# Patient Record
Sex: Female | Born: 1975 | Race: White | Hispanic: No | Smoking: Former smoker
Health system: Southern US, Community
[De-identification: ages and names within clinical notes are randomized; demographics above are authoritative.]

## PROBLEM LIST (undated history)

## (undated) ENCOUNTER — Emergency Department (HOSPITAL_COMMUNITY): Admission: EM | Payer: Self-pay | Source: Home / Self Care

## (undated) DIAGNOSIS — E119 Type 2 diabetes mellitus without complications: Secondary | ICD-10-CM

## (undated) DIAGNOSIS — K219 Gastro-esophageal reflux disease without esophagitis: Secondary | ICD-10-CM

## (undated) DIAGNOSIS — Z8659 Personal history of other mental and behavioral disorders: Secondary | ICD-10-CM

## (undated) DIAGNOSIS — I10 Essential (primary) hypertension: Secondary | ICD-10-CM

## (undated) DIAGNOSIS — E7800 Pure hypercholesterolemia, unspecified: Secondary | ICD-10-CM

## (undated) DIAGNOSIS — M4800 Spinal stenosis, site unspecified: Secondary | ICD-10-CM

## (undated) DIAGNOSIS — E559 Vitamin D deficiency, unspecified: Secondary | ICD-10-CM

## (undated) DIAGNOSIS — L744 Anhidrosis: Secondary | ICD-10-CM

## (undated) DIAGNOSIS — F32A Depression, unspecified: Secondary | ICD-10-CM

## (undated) DIAGNOSIS — F329 Major depressive disorder, single episode, unspecified: Secondary | ICD-10-CM

## (undated) DIAGNOSIS — I7300 Raynaud's syndrome without gangrene: Secondary | ICD-10-CM

## (undated) DIAGNOSIS — F909 Attention-deficit hyperactivity disorder, unspecified type: Secondary | ICD-10-CM

## (undated) DIAGNOSIS — F419 Anxiety disorder, unspecified: Secondary | ICD-10-CM

## (undated) DIAGNOSIS — F319 Bipolar disorder, unspecified: Secondary | ICD-10-CM

## (undated) DIAGNOSIS — L309 Dermatitis, unspecified: Secondary | ICD-10-CM

## (undated) DIAGNOSIS — R232 Flushing: Secondary | ICD-10-CM

## (undated) HISTORY — DX: Raynaud's syndrome without gangrene: I73.00

## (undated) HISTORY — PX: DENTAL SURGERY: SHX609

## (undated) HISTORY — DX: Bipolar disorder, unspecified: F31.9

## (undated) HISTORY — PX: WISDOM TOOTH EXTRACTION: SHX21

## (undated) HISTORY — DX: Anhidrosis: L74.4

## (undated) HISTORY — DX: Personal history of other mental and behavioral disorders: Z86.59

## (undated) HISTORY — DX: Spinal stenosis, site unspecified: M48.00

## (undated) HISTORY — DX: Attention-deficit hyperactivity disorder, unspecified type: F90.9

## (undated) HISTORY — DX: Flushing: R23.2

## (undated) HISTORY — DX: Dermatitis, unspecified: L30.9

## (undated) HISTORY — DX: Major depressive disorder, single episode, unspecified: F32.9

## (undated) HISTORY — DX: Vitamin D deficiency, unspecified: E55.9

## (undated) HISTORY — DX: Gastro-esophageal reflux disease without esophagitis: K21.9

---

## 1998-09-18 ENCOUNTER — Emergency Department (HOSPITAL_COMMUNITY): Admission: EM | Admit: 1998-09-18 | Discharge: 1998-09-19 | Payer: Self-pay | Admitting: Emergency Medicine

## 1999-09-30 ENCOUNTER — Other Ambulatory Visit: Admission: RE | Admit: 1999-09-30 | Discharge: 1999-09-30 | Payer: Self-pay | Admitting: Obstetrics & Gynecology

## 2000-10-27 ENCOUNTER — Other Ambulatory Visit: Admission: RE | Admit: 2000-10-27 | Discharge: 2000-10-27 | Payer: Self-pay | Admitting: Obstetrics and Gynecology

## 2001-09-25 ENCOUNTER — Emergency Department (HOSPITAL_COMMUNITY): Admission: EM | Admit: 2001-09-25 | Discharge: 2001-09-25 | Payer: Self-pay | Admitting: Emergency Medicine

## 2002-07-24 ENCOUNTER — Encounter: Payer: Self-pay | Admitting: *Deleted

## 2002-07-24 ENCOUNTER — Ambulatory Visit (HOSPITAL_COMMUNITY): Admission: RE | Admit: 2002-07-24 | Discharge: 2002-07-24 | Payer: Self-pay | Admitting: *Deleted

## 2002-12-02 ENCOUNTER — Ambulatory Visit (HOSPITAL_BASED_OUTPATIENT_CLINIC_OR_DEPARTMENT_OTHER): Admission: RE | Admit: 2002-12-02 | Discharge: 2002-12-02 | Payer: Self-pay | Admitting: Family Medicine

## 2003-09-12 ENCOUNTER — Emergency Department (HOSPITAL_COMMUNITY): Admission: EM | Admit: 2003-09-12 | Discharge: 2003-09-12 | Payer: Self-pay | Admitting: Emergency Medicine

## 2005-09-30 ENCOUNTER — Encounter: Admission: RE | Admit: 2005-09-30 | Discharge: 2005-09-30 | Payer: Self-pay | Admitting: Family Medicine

## 2008-07-02 ENCOUNTER — Emergency Department (HOSPITAL_COMMUNITY): Admission: EM | Admit: 2008-07-02 | Discharge: 2008-07-02 | Payer: Self-pay | Admitting: Family Medicine

## 2010-02-09 ENCOUNTER — Encounter: Admission: RE | Admit: 2010-02-09 | Discharge: 2010-02-09 | Payer: Self-pay | Admitting: Family Medicine

## 2010-06-06 ENCOUNTER — Encounter: Payer: Self-pay | Admitting: Family Medicine

## 2010-06-06 ENCOUNTER — Encounter: Payer: Self-pay | Admitting: Urology

## 2014-07-14 ENCOUNTER — Emergency Department (HOSPITAL_BASED_OUTPATIENT_CLINIC_OR_DEPARTMENT_OTHER): Payer: Self-pay

## 2014-07-14 ENCOUNTER — Emergency Department (HOSPITAL_BASED_OUTPATIENT_CLINIC_OR_DEPARTMENT_OTHER)
Admission: EM | Admit: 2014-07-14 | Discharge: 2014-07-14 | Disposition: A | Discharge status: Home / Self Care | Payer: Self-pay | Attending: Emergency Medicine | Admitting: Emergency Medicine

## 2014-07-14 ENCOUNTER — Encounter (HOSPITAL_BASED_OUTPATIENT_CLINIC_OR_DEPARTMENT_OTHER): Payer: Self-pay | Admitting: *Deleted

## 2014-07-14 DIAGNOSIS — Z8659 Personal history of other mental and behavioral disorders: Secondary | ICD-10-CM | POA: Insufficient documentation

## 2014-07-14 DIAGNOSIS — Z79899 Other long term (current) drug therapy: Secondary | ICD-10-CM | POA: Insufficient documentation

## 2014-07-14 DIAGNOSIS — Z720 Tobacco use: Secondary | ICD-10-CM | POA: Insufficient documentation

## 2014-07-14 DIAGNOSIS — Z3202 Encounter for pregnancy test, result negative: Secondary | ICD-10-CM | POA: Insufficient documentation

## 2014-07-14 DIAGNOSIS — N390 Urinary tract infection, site not specified: Secondary | ICD-10-CM | POA: Insufficient documentation

## 2014-07-14 DIAGNOSIS — K219 Gastro-esophageal reflux disease without esophagitis: Secondary | ICD-10-CM | POA: Insufficient documentation

## 2014-07-14 HISTORY — DX: Anxiety disorder, unspecified: F41.9

## 2014-07-14 HISTORY — DX: Depression, unspecified: F32.A

## 2014-07-14 HISTORY — DX: Major depressive disorder, single episode, unspecified: F32.9

## 2014-07-14 LAB — COMPREHENSIVE METABOLIC PANEL
ALBUMIN: 4.4 g/dL (ref 3.5–5.2)
ALT: 24 U/L (ref 0–35)
ANION GAP: 3 — AB (ref 5–15)
AST: 24 U/L (ref 0–37)
Alkaline Phosphatase: 56 U/L (ref 39–117)
BILIRUBIN TOTAL: 0.3 mg/dL (ref 0.3–1.2)
BUN: 13 mg/dL (ref 6–23)
CHLORIDE: 110 mmol/L (ref 96–112)
CO2: 22 mmol/L (ref 19–32)
Calcium: 8.9 mg/dL (ref 8.4–10.5)
Creatinine, Ser: 0.72 mg/dL (ref 0.50–1.10)
GFR calc Af Amer: >90 mL/min (ref >90)
GFR calc non Af Amer: >90 mL/min (ref >90)
Glucose, Bld: 116 mg/dL — ABNORMAL HIGH (ref 70–99)
POTASSIUM: 4 mmol/L (ref 3.5–5.1)
SODIUM: 135 mmol/L (ref 135–145)
TOTAL PROTEIN: 7.2 g/dL (ref 6.0–8.3)

## 2014-07-14 LAB — URINE MICROSCOPIC-ADD ON

## 2014-07-14 LAB — URINALYSIS, ROUTINE W REFLEX MICROSCOPIC
BILIRUBIN URINE: NEGATIVE
Glucose, UA: NEGATIVE mg/dL
HGB URINE DIPSTICK: NEGATIVE
KETONES UR: NEGATIVE mg/dL
Leukocytes, UA: NEGATIVE
NITRITE: POSITIVE — AB
PH: 5.5 (ref 5.0–8.0)
Protein, ur: NEGATIVE mg/dL
SPECIFIC GRAVITY, URINE: 1.014 (ref 1.005–1.030)
UROBILINOGEN UA: 0.2 mg/dL (ref 0.0–1.0)

## 2014-07-14 LAB — CBC WITH DIFFERENTIAL/PLATELET
BASOS ABS: 0 10*3/uL (ref 0.0–0.1)
Basophils Relative: 1 % (ref 0–1)
EOS PCT: 3 % (ref 0–5)
Eosinophils Absolute: 0.2 10*3/uL (ref 0.0–0.7)
HEMATOCRIT: 43.1 % (ref 36.0–46.0)
HEMOGLOBIN: 14.4 g/dL (ref 12.0–15.0)
Lymphocytes Relative: 41 % (ref 12–46)
Lymphs Abs: 2.6 10*3/uL (ref 0.7–4.0)
MCH: 32.2 pg (ref 26.0–34.0)
MCHC: 33.4 g/dL (ref 30.0–36.0)
MCV: 96.4 fL (ref 78.0–100.0)
MONO ABS: 0.5 10*3/uL (ref 0.1–1.0)
MONOS PCT: 9 % (ref 3–12)
Neutro Abs: 3 10*3/uL (ref 1.7–7.7)
Neutrophils Relative %: 46 % (ref 43–77)
Platelets: 238 10*3/uL (ref 150–400)
RBC: 4.47 MIL/uL (ref 3.87–5.11)
RDW: 12.7 % (ref 11.5–15.5)
WBC: 6.3 10*3/uL (ref 4.0–10.5)

## 2014-07-14 LAB — PREGNANCY, URINE: Preg Test, Ur: NEGATIVE

## 2014-07-14 LAB — LIPASE, BLOOD: LIPASE: 42 U/L (ref 11–59)

## 2014-07-14 MED ORDER — GI COCKTAIL ~~LOC~~
30.0000 mL | Freq: Once | ORAL | Status: AC
  Administered 2014-07-14: 30 mL via ORAL
  Filled 2014-07-14: qty 30

## 2014-07-14 MED ORDER — NITROFURANTOIN MONOHYD MACRO 100 MG PO CAPS: 100.0000 mg | ORAL_CAPSULE | Freq: Two times a day (BID) | ORAL | Status: DC

## 2014-07-14 MED ORDER — PANTOPRAZOLE SODIUM 40 MG PO TBEC: 40.0000 mg | DELAYED_RELEASE_TABLET | Freq: Every day | ORAL | Status: DC

## 2014-07-14 MED ORDER — PANTOPRAZOLE SODIUM 40 MG PO TBEC
40.0000 mg | DELAYED_RELEASE_TABLET | Freq: Once | ORAL | Status: AC
  Administered 2014-07-14: 40 mg via ORAL
  Filled 2014-07-14: qty 1

## 2014-07-14 MED ORDER — NITROFURANTOIN MONOHYD MACRO 100 MG PO CAPS
100.0000 mg | ORAL_CAPSULE | Freq: Once | ORAL | Status: AC
  Administered 2014-07-14: 100 mg via ORAL
  Filled 2014-07-14: qty 1

## 2014-07-14 NOTE — ED Provider Notes (Signed)
CSN: [id number]     Arrival date & time 07/14/14  [id number] History   First MD Initiated Contact with Patient 07/14/14 [phone]     Chief Complaint  Patient presents with  . Abdominal Pain     (Consider location/radiation/quality/duration/timing/severity/associated sxs/prior Treatment) Patient is a [age] y.o. female presenting with GERD. The history is provided by the patient.  Gastrophageal Reflux This is a new problem. The current episode started more than 1 week ago (60 days ago). The problem has not changed since onset.Pertinent negatives include no chest pain, no headaches and no shortness of breath. Nothing aggravates the symptoms. Nothing relieves the symptoms. Treatments tried: maalox mylanta and tagament. The treatment provided no relief.    Past Medical History  Diagnosis Date  . Depression   . Anxiety    History reviewed. No pertinent past surgical history. History reviewed. No pertinent family history. History  Substance Use Topics  . Smoking status: Current Every Day Smoker  . Smokeless tobacco: Not on file  . Alcohol Use: No   OB History    No data available     Review of Systems  Respiratory: Negative for shortness of breath.   Cardiovascular: Negative for chest pain.  Gastrointestinal: Negative for vomiting and constipation.  Neurological: Negative for headaches.  All other systems reviewed and are negative.     Allergies  Review of patient's allergies indicates not on file.  Home Medications   Prior to Admission medications   Medication Sig Start Date End Date Taking? Authorizing Provider  nitrofurantoin, macrocrystal-monohydrate, (MACROBID) 100 MG capsule Take 1 capsule (100 mg total) by mouth 2 (two) times daily. X 3 days 07/14/14   Ashby Leflore K Thomes Burak-Rasch, MD  pantoprazole (PROTONIX) 40 MG tablet Take 1 tablet (40 mg total) by mouth daily. 07/14/14   Osiel Stick K Rasul Decola-Rasch, MD   BP 144/91 mmHg  Pulse 97  Temp(Src) 98.3 F (36.8 C) (Oral)  Resp 23  Ht 5\' 6"   (1.676 m)  Wt 350 lb (158.759 kg)  BMI 56.52 kg/m2  SpO2 99%  LMP 06/17/2014 Physical Exam  Constitutional: She is oriented to person, place, and time. She appears well-developed and well-nourished. No distress.  HENT:  Head: Normocephalic and atraumatic.  Mouth/Throat: Oropharynx is clear and moist. No oropharyngeal exudate.  Eyes: Conjunctivae are normal. Pupils are equal, round, and reactive to light.  Neck: Normal range of motion. Neck supple.  Cardiovascular: Normal rate, regular rhythm and intact distal pulses.   Pulmonary/Chest: Effort normal and breath sounds normal. No respiratory distress. She has no wheezes. She has no rales.  Abdominal: Soft. Bowel sounds are increased. There is no tenderness. There is no rigidity, no rebound, no guarding, no tenderness at McBurney's point and negative Murphy's sign.  Musculoskeletal: Normal range of motion.  Neurological: She is alert and oriented to person, place, and time.  Skin: Skin is warm and dry.  Psychiatric: She has a normal mood and affect.    ED Course  Procedures (including critical care time) Labs Review Labs Reviewed  URINALYSIS, ROUTINE W REFLEX MICROSCOPIC - Abnormal; Notable for the following:    Nitrite POSITIVE (*)    All other components within normal limits  URINE MICROSCOPIC-ADD ON - Abnormal; Notable for the following:    Squamous Epithelial / LPF FEW (*)    Bacteria, UA MANY (*)    All other components within normal limits  COMPREHENSIVE METABOLIC PANEL - Abnormal; Notable for the following:    Glucose, Bld 116 (*)  Anion gap 3 (*)    All other components within normal limits  PREGNANCY, URINE  CBC WITH DIFFERENTIAL/PLATELET  LIPASE, BLOOD    Imaging Review No results found.   EKG Interpretation None      MDM   Final diagnoses:  UTI (lower urinary tract infection)  Gastroesophageal reflux disease without esophagitis    Patient felt markedly improved post protonix will prescribe same and  macrobid for UTI and close follow up with GI.  Strict return precautions given   Anae Hams K Kaci Freel-Rasch, MD 07/14/14 [phone]

## 2014-07-14 NOTE — ED Notes (Signed)
Transported to xray 

## 2014-07-14 NOTE — ED Notes (Signed)
MD with pt  

## 2014-07-14 NOTE — ED Notes (Signed)
Returned from xray

## 2014-07-14 NOTE — ED Notes (Signed)
C/o mid upper abd pain that started around Christmas time. States she has had issues with reflux. Describes pain as sharp and intermittent. States at times pain radiates into her back. Denies any vomiting. C/o mild nausea at times. Denies any present. States she is taking Rolaids, but with little relief. Has tried OTC tagament without relief.

## 2014-07-16 ENCOUNTER — Ambulatory Visit: Payer: Self-pay | Attending: Internal Medicine | Admitting: Internal Medicine

## 2014-07-16 ENCOUNTER — Encounter: Payer: Self-pay | Admitting: Internal Medicine

## 2014-07-16 VITALS — BP 158/91 | HR 91 | Temp 98.4°F | Resp 16 | Ht 66.0 in | Wt 331.0 lb

## 2014-07-16 DIAGNOSIS — I10 Essential (primary) hypertension: Secondary | ICD-10-CM | POA: Insufficient documentation

## 2014-07-16 DIAGNOSIS — R030 Elevated blood-pressure reading, without diagnosis of hypertension: Secondary | ICD-10-CM

## 2014-07-16 DIAGNOSIS — F32A Depression, unspecified: Secondary | ICD-10-CM

## 2014-07-16 DIAGNOSIS — IMO0001 Reserved for inherently not codable concepts without codable children: Secondary | ICD-10-CM

## 2014-07-16 DIAGNOSIS — K219 Gastro-esophageal reflux disease without esophagitis: Secondary | ICD-10-CM | POA: Insufficient documentation

## 2014-07-16 DIAGNOSIS — F329 Major depressive disorder, single episode, unspecified: Secondary | ICD-10-CM | POA: Insufficient documentation

## 2014-07-16 MED ORDER — PANTOPRAZOLE SODIUM 40 MG PO TBEC: 40.0000 mg | DELAYED_RELEASE_TABLET | Freq: Every day | ORAL | Status: DC

## 2014-07-16 NOTE — Progress Notes (Signed)
Patient ID: Patricia Calderon, female   DOB: [birth year], 39 y.o.   MRN: [id number]  OZH:[id number]  GEX:[id number]  DOB - 1975-08-29  CC:  Chief Complaint  Patient presents with  . Establish Care       HPI: Patricia Calderon is a 39 y.o. female here today to establish medical care.  Patient has a past medical history of anxiety, depression, and GERD. Patient presents to clinic today with several complaints.  Was diagnosed in 1996 but has not had any treatment since 2012 when she lost her insurance. She was paxil and xanax.   Has been having acid reflux since December with lump in throat, belching, slight vomiting, some epigastric pain. Worse at nighttime. She was recently given Protonix two days ago at the local ER.    Patient has No headache, No chest pain, No abdominal pain - No Nausea, No new weakness tingling or numbness, No Cough - SOB.  Not on File Past Medical History  Diagnosis Date  . Depression   . Anxiety   . GERD (gastroesophageal reflux disease)   . Vitamin D deficiency    Current Outpatient Prescriptions on File Prior to Visit  Medication Sig Dispense Refill  . nitrofurantoin, macrocrystal-monohydrate, (MACROBID) 100 MG capsule Take 1 capsule (100 mg total) by mouth 2 (two) times daily. X 3 days 6 capsule 0  . pantoprazole (PROTONIX) 40 MG tablet Take 1 tablet (40 mg total) by mouth daily. 30 tablet 0   No current facility-administered medications on file prior to visit.   Family History  Problem Relation Age of Onset  . Diabetes Mother   . Cancer Mother   . Diabetes Father    History   Social History  . Marital Status: Single    Spouse Name: N/A  . Number of Children: N/A  . Years of Education: N/A   Occupational History  . Not on file.   Social History Main Topics  . Smoking status: Current Every Day Smoker -- 0.25 packs/day  . Smokeless tobacco: Not on file  . Alcohol Use: No  . Drug Use: No  . Sexual Activity: Not on file   Other Topics Concern    . Not on file   Social History Narrative    Review of Systems: See HPI   Objective:   Filed Vitals:   07/16/14 1455  BP: 158/91  Pulse: 91  Temp: 98.4 F (36.9 C)  Resp: 16    Physical Exam  Cardiovascular: Normal rate, regular rhythm and normal heart sounds.   Pulmonary/Chest: Effort normal and breath sounds normal.  Abdominal: Soft. Bowel sounds are normal. There is no tenderness.  Musculoskeletal: She exhibits no edema.     Lab Results  Component Value Date   WBC 6.3 07/14/2014   HGB 14.4 07/14/2014   HCT 43.1 07/14/2014   MCV 96.4 07/14/2014   PLT 238 07/14/2014   Lab Results  Component Value Date   CREATININE 0.72 07/14/2014   BUN 13 07/14/2014   NA 135 07/14/2014   K 4.0 07/14/2014   CL 110 07/14/2014   CO2 22 07/14/2014    No results found for: HGBA1C Lipid Panel  No results found for: CHOL, TRIG, HDL, CHOLHDL, VLDL, LDLCALC     Assessment and plan:   Loree was seen today for establish care.  Diagnoses and all orders for this visit:  Elevated BP If BP is elevated at next visit, I will begin patient on HCTZ 12.5 mg.   Explained to patient  diet modifications that may contribute to elevated BP. Patient will avoid foods that are high in sodium such as  canned soups and vegetables, tomato juice, commercial baked goods, commercially prepared frozen or canned entrees and sauces. Avoid salty snacks, added salt when cooking, and substituting for low sodium herbs or spices Explained exercise regimen of cardio at least three times weekly to help lower BP and cholesterol  Gastroesophageal reflux disease, esophagitis presence not specified Orders: -     pantoprazole (PROTONIX) 40 MG tablet; Take 1 tablet (40 mg total) by mouth daily. Discussed diet and weight with patient relating to acid reflux.  Went over things that may exacerbate acid reflux such as tomatoes, spicy foods, coffee, carbonated beverages, chocolates, etc.  Advised patient to avoid laying  down at least two hours after meals and sleep with HOB elevated.   Depression Ref[facility] to [facility], she reports a family history of bipolar disorder   Return for 1-2 physical .     Holland CommonsKECK, Shanica Castellanos, NP-C [facility] [phone] 07/16/2014, 3:47 PM

## 2014-07-16 NOTE — Progress Notes (Signed)
Patient is here to establish care Patient is c/o of acid reflux  Patient is expressing anxiety and depression Patient has no insurance and is asking about free medications and orange card, [facility] discout application Patient has not had pap smear in years(10+)

## 2014-07-16 NOTE — Patient Instructions (Signed)

## 2014-08-06 ENCOUNTER — Encounter: Payer: Self-pay | Admitting: Internal Medicine

## 2014-08-11 ENCOUNTER — Ambulatory Visit: Payer: Self-pay

## 2014-08-13 ENCOUNTER — Encounter: Payer: Self-pay | Admitting: Internal Medicine

## 2014-08-27 ENCOUNTER — Ambulatory Visit: Payer: Self-pay | Attending: Internal Medicine | Admitting: Internal Medicine

## 2014-08-27 ENCOUNTER — Encounter: Payer: Self-pay | Admitting: Internal Medicine

## 2014-08-27 VITALS — BP 149/86 | HR 97 | Temp 98.7°F | Resp 16 | Ht 66.0 in | Wt 335.0 lb

## 2014-08-27 DIAGNOSIS — M25561 Pain in right knee: Secondary | ICD-10-CM

## 2014-08-27 DIAGNOSIS — K219 Gastro-esophageal reflux disease without esophagitis: Secondary | ICD-10-CM

## 2014-08-27 MED ORDER — OMEPRAZOLE 20 MG PO CPDR: 20.0000 mg | DELAYED_RELEASE_CAPSULE | Freq: Two times a day (BID) | ORAL | Status: DC

## 2014-08-27 NOTE — Progress Notes (Signed)
F/U Acid Reflux Complaining of Rt Knee pain x 3 wk  No Hx injury

## 2014-08-27 NOTE — Progress Notes (Signed)
Patient ID: Patricia HoffmannJennifer E Calderon, female   DOB: 28-Aug-1975, 39 y.o.   MRN: [id number]  CC: acid reflux, knee pain  HPI: Patricia ColeJennifer Calderon is a 39 y.o. female here today for a follow up visit.  Patient has past medical history of depression, anxiety, and GERD. Patient reports that she has dramatically changed her diet and has eliminated many of the foods that caused her to have acid reflux. She states that she takes her protonix around 4 am every morning and does not eat until 8 am. She states that she constantly has a stomach churning sensation and reflux in her throat. Denies diarrha. Has some abdominal pain and nausea. She has c/o of right knee pain for 3 weeks. She reports that the pain has improved dramatically. She has been taking oTC pain aid that have helped with discomfort. She denies injury but reports that her knee felt as if it was popping out of place.   Patient has No headache, No chest pain, No new weakness tingling or numbness, No Cough - SOB.  No Known Allergies Past Medical History  Diagnosis Date  . Depression   . Anxiety   . GERD (gastroesophageal reflux disease)   . Vitamin D deficiency    Current Outpatient Prescriptions on File Prior to Visit  Medication Sig Dispense Refill  . acetaminophen (TYLENOL) 325 MG tablet Take 650 mg by mouth every 6 (six) hours as needed for mild pain.    . pantoprazole (PROTONIX) 40 MG tablet Take 1 tablet (40 mg total) by mouth daily. 30 tablet 4  . nitrofurantoin, macrocrystal-monohydrate, (MACROBID) 100 MG capsule Take 1 capsule (100 mg total) by mouth 2 (two) times daily. X 3 days (Patient not taking: Reported on 08/27/2014) 6 capsule 0   No current facility-administered medications on file prior to visit.   Family History  Problem Relation Age of Onset  . Diabetes Mother   . Cancer Mother   . Diabetes Father    History   Social History  . Marital Status: Single    Spouse Name: N/A  . Number of Children: N/A  . Years of Education: N/A    Occupational History  . Not on file.   Social History Main Topics  . Smoking status: Current Every Day Smoker -- 0.25 packs/day  . Smokeless tobacco: Not on file  . Alcohol Use: No  . Drug Use: No  . Sexual Activity: Not on file   Other Topics Concern  . Not on file   Social History Narrative    Review of Systems: See HPI   Objective:   Filed Vitals:   08/27/14 1428  BP: 149/86  Pulse: 97  Temp: 98.7 F (37.1 C)  Resp: 16    Physical Exam  Constitutional: She is oriented to person, place, and time.  Cardiovascular: Normal rate, regular rhythm and normal heart sounds.   Pulmonary/Chest: Effort normal and breath sounds normal.  Abdominal: Soft. Bowel sounds are normal. She exhibits no distension. There is no tenderness.  Neurological: She is alert and oriented to person, place, and time.     Lab Results  Component Value Date   WBC 6.3 07/14/2014   HGB 14.4 07/14/2014   HCT 43.1 07/14/2014   MCV 96.4 07/14/2014   PLT 238 07/14/2014   Lab Results  Component Value Date   CREATININE 0.72 07/14/2014   BUN 13 07/14/2014   NA 135 07/14/2014   K 4.0 07/14/2014   CL 110 07/14/2014   CO2 22 07/14/2014  No results found for: HGBA1C Lipid Panel  No results found for: CHOL, TRIG, HDL, CHOLHDL, VLDL, LDLCALC     Assessment and plan:   Diagnoses and all orders for this visit:  Gastroesophageal reflux disease, esophagitis presence not specified Orders: -     omeprazole (PRILOSEC) 20 MG capsule; Take 1 capsule (20 mg total) by mouth 2 (two) times daily before a meal. Patient will stop Protonix and switch to omeprazole 20 mg twice a day. If patient is unable to get relief from acid reflux with this regimen, and we may possibly need to do GI referral.  Right knee pain Pacemaker T the take Tylenol  Morbid obesity Stressed weight loss the patient in order to assist with GERD and pressure on her knees   Patient may follow-up as symptoms do not improve       Patricia Commons, NP-C [facility] [phone] 08/27/2014, 3:15 PM

## 2014-08-27 NOTE — Patient Instructions (Signed)
Gastroesophageal Reflux Disease, Adult Gastroesophageal reflux disease (GERD) happens when acid from your stomach flows up into the esophagus. When acid comes in contact with the esophagus, the acid causes soreness (inflammation) in the esophagus. Over time, GERD may create small holes (ulcers) in the lining of the esophagus. CAUSES   Increased body weight. This puts pressure on the stomach, making acid rise from the stomach into the esophagus.  Smoking. This increases acid production in the stomach.  Drinking alcohol. This causes decreased pressure in the lower esophageal sphincter (valve or ring of muscle between the esophagus and stomach), allowing acid from the stomach into the esophagus.  Late evening meals and a full stomach. This increases pressure and acid production in the stomach.  A malformed lower esophageal sphincter. Sometimes, no cause is found. SYMPTOMS   Burning pain in the lower part of the mid-chest behind the breastbone and in the mid-stomach area. This may occur twice a week or more often.  Trouble swallowing.  Sore throat.  Dry cough.  Asthma-like symptoms including chest tightness, shortness of breath, or wheezing. DIAGNOSIS  Your caregiver may be able to diagnose GERD based on your symptoms. In some cases, X-rays and other tests may be done to check for complications or to check the condition of your stomach and esophagus. TREATMENT  Your caregiver may recommend over-the-counter or prescription medicines to help decrease acid production. Ask your caregiver before starting or adding any new medicines.  HOME CARE INSTRUCTIONS   Change the factors that you can control. Ask your caregiver for guidance concerning weight loss, quitting smoking, and alcohol consumption.  Avoid foods and drinks that make your symptoms worse, such as:  Caffeine or alcoholic drinks.  Chocolate.  Peppermint or mint flavorings.  Garlic and onions.  Spicy foods.  Citrus fruits,  such as oranges, lemons, or limes.  Tomato-based foods such as sauce, chili, salsa, and pizza.  Fried and fatty foods.  Avoid lying down for the 3 hours prior to your bedtime or prior to taking a nap.  Eat small, frequent meals instead of large meals.  Wear loose-fitting clothing. Do not wear anything tight around your waist that causes pressure on your stomach.  Raise the head of your bed 6 to 8 inches with wood blocks to help you sleep. Extra pillows will not help.  Only take over-the-counter or prescription medicines for pain, discomfort, or fever as directed by your caregiver.  Do not take aspirin, ibuprofen, or other nonsteroidal anti-inflammatory drugs (NSAIDs). SEEK IMMEDIATE MEDICAL CARE IF:   You have pain in your arms, neck, jaw, teeth, or back.  Your pain increases or changes in intensity or duration.  You develop nausea, vomiting, or sweating (diaphoresis).  You develop shortness of breath, or you faint.  Your vomit is green, yellow, black, or looks like coffee grounds or blood.  Your stool is red, bloody, or black. These symptoms could be signs of other problems, such as heart disease, gastric bleeding, or esophageal bleeding. MAKE SURE YOU:   Understand these instructions.  Will watch your condition.  Will get help right away if you are not doing well or get worse. Document Released: 02/09/2005 Document Revised: 07/25/2011 Document Reviewed: 11/19/2010 Exit[employer] Patient Information 2015 [employer]. This information is not intended to replace advice given to you by your health care provider. Make sure you discuss any questions you have with your health care provider.  

## 2014-08-31 DIAGNOSIS — K219 Gastro-esophageal reflux disease without esophagitis: Secondary | ICD-10-CM | POA: Insufficient documentation

## 2014-10-24 ENCOUNTER — Ambulatory Visit: Payer: Self-pay

## 2014-12-12 ENCOUNTER — Ambulatory Visit: Payer: Self-pay | Attending: Internal Medicine | Admitting: Internal Medicine

## 2014-12-12 ENCOUNTER — Encounter: Payer: Self-pay | Admitting: Internal Medicine

## 2014-12-12 VITALS — BP 145/82 | HR 107 | Temp 98.4°F | Resp 16 | Ht 66.5 in | Wt 334.0 lb

## 2014-12-12 DIAGNOSIS — Z720 Tobacco use: Secondary | ICD-10-CM

## 2014-12-12 DIAGNOSIS — K219 Gastro-esophageal reflux disease without esophagitis: Secondary | ICD-10-CM | POA: Insufficient documentation

## 2014-12-12 DIAGNOSIS — N644 Mastodynia: Secondary | ICD-10-CM | POA: Insufficient documentation

## 2014-12-12 DIAGNOSIS — Z716 Tobacco abuse counseling: Secondary | ICD-10-CM | POA: Insufficient documentation

## 2014-12-12 DIAGNOSIS — F17210 Nicotine dependence, cigarettes, uncomplicated: Secondary | ICD-10-CM | POA: Insufficient documentation

## 2014-12-12 DIAGNOSIS — F419 Anxiety disorder, unspecified: Secondary | ICD-10-CM | POA: Insufficient documentation

## 2014-12-12 DIAGNOSIS — R531 Weakness: Secondary | ICD-10-CM | POA: Insufficient documentation

## 2014-12-12 DIAGNOSIS — Z1239 Encounter for other screening for malignant neoplasm of breast: Secondary | ICD-10-CM

## 2014-12-12 DIAGNOSIS — Z803 Family history of malignant neoplasm of breast: Secondary | ICD-10-CM | POA: Insufficient documentation

## 2014-12-12 DIAGNOSIS — Z79899 Other long term (current) drug therapy: Secondary | ICD-10-CM | POA: Insufficient documentation

## 2014-12-12 DIAGNOSIS — I10 Essential (primary) hypertension: Secondary | ICD-10-CM | POA: Insufficient documentation

## 2014-12-12 DIAGNOSIS — F329 Major depressive disorder, single episode, unspecified: Secondary | ICD-10-CM | POA: Insufficient documentation

## 2014-12-12 DIAGNOSIS — E559 Vitamin D deficiency, unspecified: Secondary | ICD-10-CM | POA: Insufficient documentation

## 2014-12-12 DIAGNOSIS — F17200 Nicotine dependence, unspecified, uncomplicated: Secondary | ICD-10-CM

## 2014-12-12 LAB — CBC
HEMATOCRIT: 43.2 % (ref 36.0–46.0)
Hemoglobin: 14.2 g/dL (ref 12.0–15.0)
MCH: 30.6 pg (ref 26.0–34.0)
MCHC: 32.9 g/dL (ref 30.0–36.0)
MCV: 93.1 fL (ref 78.0–100.0)
MPV: 9.7 fL (ref 8.6–12.4)
Platelets: 332 10*3/uL (ref 150–400)
RBC: 4.64 MIL/uL (ref 3.87–5.11)
RDW: 13.7 % (ref 11.5–15.5)
WBC: 10 10*3/uL (ref 4.0–10.5)

## 2014-12-12 LAB — TSH: TSH: 3.233 u[IU]/mL (ref 0.350–4.500)

## 2014-12-12 MED ORDER — HYDROXYZINE HCL 25 MG PO TABS
25.0000 mg | ORAL_TABLET | Freq: Two times a day (BID) | ORAL | Status: DC | PRN
Start: 2014-12-12 — End: 2016-01-17

## 2014-12-12 MED ORDER — AMLODIPINE BESYLATE 5 MG PO TABS: 5.0000 mg | ORAL_TABLET | Freq: Every day | ORAL | Status: DC

## 2014-12-12 NOTE — Progress Notes (Signed)
Patient ID: Patricia Calderon, female   DOB: 1975/08/08, 39 y.o.   MRN: [id number]  CC: right breast pain, dizziness   HPI: Patricia Calderon is a 39 y.o. female here today for a follow up visit.  Patient has past medical history of depression, anxiety, GERD. Patient reports pain in right breast for past week. She denies any lumps or nipple discharge. LMP was 7/20. She is concerned because she has a family history of breast cancer in her Mother.   Patient also complains of dizziness over the past week. She reports that she suddenly gets dizzy. After taking several deep breaths she feels less dizzy but feels really weak after.  No room spinning or off balance. "Just heavy feeling".  Patient reports that she has decreased from 30 cigarettes per day down to 7 in 4 month period. She has been [facility] since April. She is still waiting on the prescribing provider which she will see August 25th.   Patient has No headache, No chest pain, No abdominal pain - No Nausea, No new weakness tingling or numbness, No Cough - SOB.  No Known Allergies Past Medical History  Diagnosis Date  . Depression   . Anxiety   . GERD (gastroesophageal reflux disease)   . Vitamin D deficiency    Current Outpatient Prescriptions on File Prior to Visit  Medication Sig Dispense Refill  . acetaminophen (TYLENOL) 325 MG tablet Take 650 mg by mouth every 6 (six) hours as needed for mild pain.    Marland Kitchen omeprazole (PRILOSEC) 20 MG capsule Take 1 capsule (20 mg total) by mouth 2 (two) times daily before a meal. 60 capsule 4  . nitrofurantoin, macrocrystal-monohydrate, (MACROBID) 100 MG capsule Take 1 capsule (100 mg total) by mouth 2 (two) times daily. X 3 days (Patient not taking: Reported on 12/12/2014) 6 capsule 0   No current facility-administered medications on file prior to visit.   Family History  Problem Relation Age of Onset  . Diabetes Mother   . Cancer Mother   . Diabetes Father     History   Social History  . Marital Status: Single    Spouse Name: N/A  . Number of Children: N/A  . Years of Education: N/A   Occupational History  . Not on file.   Social History Main Topics  . Smoking status: Current Every Day Smoker -- 0.25 packs/day  . Smokeless tobacco: Not on file  . Alcohol Use: No  . Drug Use: No  . Sexual Activity: Not on file   Other Topics Concern  . Not on file   Social History Narrative    Review of Systems: See HPI   Objective:   Filed Vitals:   12/12/14 1420  BP: 145/82  Pulse: 107  Temp: 98.4 F (36.9 C)  Resp: 16    Physical Exam  Constitutional: She is oriented to person, place, and time.  Cardiovascular: Normal rate, regular rhythm and normal heart sounds.   Pulmonary/Chest: Effort normal and breath sounds normal.  Musculoskeletal: She exhibits no edema.  Neurological: She is alert and oriented to person, place, and time.  Skin: Skin is warm and dry.     Lab Results  Component Value Date   WBC 6.3 07/14/2014   HGB 14.4 07/14/2014   HCT 43.1 07/14/2014   MCV 96.4 07/14/2014   PLT 238 07/14/2014   Lab Results  Component Value Date   CREATININE 0.72 07/14/2014   BUN 13 07/14/2014  NA 135 07/14/2014   K 4.0 07/14/2014   CL 110 07/14/2014   CO2 22 07/14/2014    No results found for: HGBA1C Lipid Panel  No results found for: CHOL, TRIG, HDL, CHOLHDL, VLDL, LDLCALC     Assessment and plan:   Patricia Calderon was seen today for breast pain.  Diagnoses and all orders for this visit:  Essential hypertension Orders: -     Begin amLODipine (NORVASC) 5 MG tablet; Take 1 tablet (5 mg total) by mouth daily. Patient's BP has been elevated on each visit. Will begin on medication and recheck pressure in 2 weeks.   Anxiety Orders: -     hydrOXYzine (ATARAX/VISTARIL) 25 MG tablet; Take 1 tablet (25 mg total) by mouth 2 (two) times daily as needed. -     TSH I will give patient a short course of vistaril until she is  able to see her prescribing provider.   Weakness Orders: -     Basic Metabolic Panel -     CBC -     Vitamin D, 25-hydroxy I believe patient is experiencing weakness versus dizziness per her description. I have advised for her to change positions slowly to prevent dizziness. Will check all labs today  Morbid obesity Addressed weight loss with patient. She states that she would like to work on smoking cessation first and then address weight. Weight loss discussed at length and its complications to health.  Patient will loss 10 months by next visit in 3 months.  Diet and exercise discussed as well as calorie intake.  Tobacco use disorder I have praised patient's ability to cut back dramatically on smoking. We have addressed anxiety and stress and ways for her to remain successful in her journey of smoking cessation. Discussed for 5 minutes with patient.   Breast cancer screening Orders: -     MM Digital Screening; Future  Return in about 2 weeks (around 12/26/2014) for Nurse Visit-BP check and 3 mo PCP HTN.       Ambrose Finland, NP-C [facility] [phone] 12/12/2014, 2:37 PM

## 2014-12-12 NOTE — Progress Notes (Signed)
Complaining of rt breast pain no lumps no discharge  Hx breast cancer in the family -Mother  Dizziness with menses  Hx Tobacco- 7 cigarette per day

## 2014-12-12 NOTE — Patient Instructions (Addendum)
I have also started you on a low dose BP medication called amlodipine. If possible I would like you to come back in 2 weeks to let the nurse check your blood pressure and make sure you are not having nay reaction to the medications   Hydroxyzine capsules or tablets What is this medicine? HYDROXYZINE (hye DROX i zeen) is an antihistamine. This medicine is used to treat allergy symptoms. It is also used to treat anxiety and tension. This medicine can be used with other medicines to induce sleep before surgery. This medicine may be used for other purposes; ask your health care provider or pharmacist if you have questions. COMMON BRAND NAME(S): ANX, Atarax, Rezine, Vistaril What should I tell my health care provider before I take this medicine? They need to know if you have any of these conditions: -any chronic illness -difficulty passing urine -glaucoma -heart disease -kidney disease -liver disease -lung disease -an unusual or allergic reaction to hydroxyzine, cetirizine, other medicines, foods, dyes, or preservatives -pregnant or trying to get pregnant -breast-feeding How should I use this medicine? Take this medicine by mouth with a full glass of water. Follow the directions on the prescription label. You may take this medicine with food or on an empty stomach. Take your medicine at regular intervals. Do not take your medicine more often than directed. Talk to your pediatrician regarding the use of this medicine in children. Special care may be needed. While this drug may be prescribed for children as young as 69 years of age for selected conditions, precautions do apply. Patients over 12 years old may have a stronger reaction and need a smaller dose. Overdosage: If you think you have taken too much of this medicine contact a poison control center or emergency room at once. NOTE: This medicine is only for you. Do not share this medicine with others. What if I miss a dose? If you miss a dose,  take it as soon as you can. If it is almost time for your next dose, take only that dose. Do not take double or extra doses. What may interact with this medicine? -alcohol -barbiturate medicines for sleep or seizures -medicines for colds, allergies -medicines for depression, anxiety, or emotional disturbances -medicines for pain -medicines for sleep -muscle relaxants This list may not describe all possible interactions. Give your health care provider a list of all the medicines, herbs, non-prescription drugs, or dietary supplements you use. Also tell them if you smoke, drink alcohol, or use illegal drugs. Some items may interact with your medicine. What should I watch for while using this medicine? Tell your doctor or health care professional if your symptoms do not improve. You may get drowsy or dizzy. Do not drive, use machinery, or do anything that needs mental alertness until you know how this medicine affects you. Do not stand or sit up quickly, especially if you are an older patient. This reduces the risk of dizzy or fainting spells. Alcohol may interfere with the effect of this medicine. Avoid alcoholic drinks. Your mouth may get dry. Chewing sugarless gum or sucking hard candy, and drinking plenty of water may help. Contact your doctor if the problem does not go away or is severe. This medicine may cause dry eyes and blurred vision. If you wear contact lenses you may feel some discomfort. Lubricating drops may help. See your eye doctor if the problem does not go away or is severe. If you are receiving skin tests for allergies, tell your doctor you  are using this medicine. What side effects may I notice from receiving this medicine? Side effects that you should report to your doctor or health care professional as soon as possible: -fast or irregular heartbeat -difficulty passing urine -seizures -slurred speech or confusion -tremor Side effects that usually do not require medical  attention (report to your doctor or health care professional if they continue or are bothersome): -constipation -drowsiness -fatigue -headache -stomach upset This list may not describe all possible side effects. Call your doctor for medical advice about side effects. You may report side effects to FDA at 1-800-FDA-1088. Where should I keep my medicine? Keep out of the reach of children. Store at room temperature between 15 and 30 degrees C (59 and 86 degrees F). Keep container tightly closed. Throw away any unused medicine after the expiration date. NOTE: This sheet is a summary. It may not cover all possible information. If you have questions about this medicine, talk to your doctor, pharmacist, or health care provider.  2015, [employer]vier/Gold Standard. (2007-09-14 14:50:59)

## 2014-12-13 LAB — VITAMIN D 25 HYDROXY (VIT D DEFICIENCY, FRACTURES): Vit D, 25-Hydroxy: 20 ng/mL — ABNORMAL LOW (ref 30–100)

## 2014-12-13 LAB — BASIC METABOLIC PANEL
BUN: 12 mg/dL (ref 7–25)
CO2: 21 mmol/L (ref 20–31)
Calcium: 9.4 mg/dL (ref 8.6–10.2)
Chloride: 105 mmol/L (ref 98–110)
Creat: 0.73 mg/dL (ref 0.50–1.10)
Glucose, Bld: 107 mg/dL — ABNORMAL HIGH (ref 65–99)
Potassium: 4.1 mmol/L (ref 3.5–5.3)
SODIUM: 137 mmol/L (ref 135–146)

## 2014-12-15 ENCOUNTER — Telehealth: Payer: Self-pay

## 2014-12-15 NOTE — Telephone Encounter (Signed)
Nurse called patient, patient verified date of birth. Patient aware of normal labs except low Vit D.  Patient agrees to get OTC Vit D 800 IU to take daily to help raise levels.  Patient aware of Vit D helping with bone development and fatigue.  Patient voices understanding and has no further questions at this time.

## 2014-12-15 NOTE — Telephone Encounter (Signed)
-----   Message from Patricia Finland, NP sent at 12/14/2014  8:52 PM EDT ----- Everything normal except low vitamin d. Please get OTC vitamin D 800 IU to take daily to help raise levels. This will help with bone development and fatigue

## 2014-12-24 ENCOUNTER — Telehealth: Payer: Self-pay | Admitting: Internal Medicine

## 2014-12-24 NOTE — Telephone Encounter (Signed)
Patient called to request a referral to the [facility] for her mammogram. Please f/u

## 2014-12-27 ENCOUNTER — Emergency Department (HOSPITAL_BASED_OUTPATIENT_CLINIC_OR_DEPARTMENT_OTHER)
Admission: EM | Admit: 2014-12-27 | Discharge: 2014-12-27 | Disposition: A | Discharge status: Home / Self Care | Payer: Self-pay | Attending: Physician Assistant | Admitting: Physician Assistant

## 2014-12-27 ENCOUNTER — Encounter (HOSPITAL_BASED_OUTPATIENT_CLINIC_OR_DEPARTMENT_OTHER): Payer: Self-pay | Admitting: *Deleted

## 2014-12-27 ENCOUNTER — Emergency Department (HOSPITAL_BASED_OUTPATIENT_CLINIC_OR_DEPARTMENT_OTHER): Payer: Self-pay

## 2014-12-27 DIAGNOSIS — Z720 Tobacco use: Secondary | ICD-10-CM | POA: Insufficient documentation

## 2014-12-27 DIAGNOSIS — Z79899 Other long term (current) drug therapy: Secondary | ICD-10-CM | POA: Insufficient documentation

## 2014-12-27 DIAGNOSIS — E669 Obesity, unspecified: Secondary | ICD-10-CM | POA: Insufficient documentation

## 2014-12-27 DIAGNOSIS — F419 Anxiety disorder, unspecified: Secondary | ICD-10-CM | POA: Insufficient documentation

## 2014-12-27 DIAGNOSIS — K219 Gastro-esophageal reflux disease without esophagitis: Secondary | ICD-10-CM | POA: Insufficient documentation

## 2014-12-27 DIAGNOSIS — F410 Panic disorder [episodic paroxysmal anxiety] without agoraphobia: Secondary | ICD-10-CM

## 2014-12-27 DIAGNOSIS — Z3202 Encounter for pregnancy test, result negative: Secondary | ICD-10-CM | POA: Insufficient documentation

## 2014-12-27 LAB — URINALYSIS, ROUTINE W REFLEX MICROSCOPIC
BILIRUBIN URINE: NEGATIVE
Glucose, UA: NEGATIVE mg/dL
Ketones, ur: NEGATIVE mg/dL
Leukocytes, UA: NEGATIVE
Nitrite: POSITIVE — AB
Protein, ur: NEGATIVE mg/dL
SPECIFIC GRAVITY, URINE: 1.011 (ref 1.005–1.030)
Urobilinogen, UA: 0.2 mg/dL (ref 0.0–1.0)
pH: 7 (ref 5.0–8.0)

## 2014-12-27 LAB — TROPONIN I: Troponin I: <0.03 ng/mL (ref <0.031)

## 2014-12-27 LAB — URINE MICROSCOPIC-ADD ON

## 2014-12-27 LAB — CBG MONITORING, ED: Glucose-Capillary: 93 mg/dL (ref 65–99)

## 2014-12-27 LAB — BASIC METABOLIC PANEL
Anion gap: 8 (ref 5–15)
BUN: 15 mg/dL (ref 6–20)
CO2: 24 mmol/L (ref 22–32)
Calcium: 8.9 mg/dL (ref 8.9–10.3)
Chloride: 107 mmol/L (ref 101–111)
Creatinine, Ser: 0.73 mg/dL (ref 0.44–1.00)
GFR calc Af Amer: >60 mL/min (ref >60)
GFR calc non Af Amer: >60 mL/min (ref >60)
Glucose, Bld: 92 mg/dL (ref 65–99)
POTASSIUM: 3.8 mmol/L (ref 3.5–5.1)
Sodium: 139 mmol/L (ref 135–145)

## 2014-12-27 LAB — CBC
HCT: 40.3 % (ref 36.0–46.0)
Hemoglobin: 13.5 g/dL (ref 12.0–15.0)
MCH: 31.9 pg (ref 26.0–34.0)
MCHC: 33.5 g/dL (ref 30.0–36.0)
MCV: 95.3 fL (ref 78.0–100.0)
Platelets: 233 10*3/uL (ref 150–400)
RBC: 4.23 MIL/uL (ref 3.87–5.11)
RDW: 13.1 % (ref 11.5–15.5)
WBC: 7 10*3/uL (ref 4.0–10.5)

## 2014-12-27 LAB — PREGNANCY, URINE: Preg Test, Ur: NEGATIVE

## 2014-12-27 NOTE — ED Notes (Signed)
Reports intermittent dizziness-lightheadedness x 6 weeks- states spell this morning lasted about an hour- ?anxiety

## 2014-12-27 NOTE — Discharge Instructions (Signed)
Please follow-up with your regular provider. Panic Attacks Panic attacks are sudden, short-livedsurges of severe anxiety, fear, or discomfort. They may occur for no reason when you are relaxed, when you are anxious, or when you are sleeping. Panic attacks may occur for a number of reasons:   Healthy people occasionally have panic attacks in extreme, life-threatening situations, such as war or natural disasters. Normal anxiety is a protective mechanism of the body that helps [place] react to danger (fight or flight response).  Panic attacks are often seen with anxiety disorders, such as panic disorder, social anxiety disorder, generalized anxiety disorder, and phobias. Anxiety disorders cause excessive or uncontrollable anxiety. They may interfere with your relationships or other life activities.  Panic attacks are sometimes seen with other mental illnesses, such as depression and posttraumatic stress disorder.  Certain medical conditions, prescription medicines, and drugs of abuse can cause panic attacks. SYMPTOMS  Panic attacks start suddenly, peak within 20 minutes, and are accompanied by four or more of the following symptoms:  Pounding heart or fast heart rate (palpitations).  Sweating.  Trembling or shaking.  Shortness of breath or feeling smothered.  Feeling choked.  Chest pain or discomfort.  Nausea or strange feeling in your stomach.  Dizziness, light-headedness, or feeling like you will faint.  Chills or hot flushes.  Numbness or tingling in your lips or hands and feet.  Feeling that things are not real or feeling that you are not yourself.  Fear of losing control or going crazy.  Fear of dying. Some of these symptoms can mimic serious medical conditions. For example, you may think you are having a heart attack. Although panic attacks can be very scary, they are not life threatening. DIAGNOSIS  Panic attacks are diagnosed through an assessment by your health care  provider. Your health care provider will ask questions about your symptoms, such as where and when they occurred. Your health care provider will also ask about your medical history and use of alcohol and drugs, including prescription medicines. Your health care provider may order blood tests or other studies to rule out a serious medical condition. Your health care provider may refer you to a mental health professional for further evaluation. TREATMENT   Most healthy people who have one or two panic attacks in an extreme, life-threatening situation will not require treatment.  The treatment for panic attacks associated with anxiety disorders or other mental illness typically involves counseling with a mental health professional, medicine, or a combination of both. Your health care provider will help determine what treatment is best for you.  Panic attacks due to physical illness usually go away with treatment of the illness. If prescription medicine is causing panic attacks, talk with your health care provider about stopping the medicine, decreasing the dose, or substituting another medicine.  Panic attacks due to alcohol or drug abuse go away with abstinence. Some adults need professional help in order to stop drinking or using drugs. HOME CARE INSTRUCTIONS   Take all medicines as directed by your health care provider.   Schedule and attend follow-up visits as directed by your health care provider. It is important to keep all your appointments. SEEK MEDICAL CARE IF:  You are not able to take your medicines as prescribed.  Your symptoms do not improve or get worse. SEEK IMMEDIATE MEDICAL CARE IF:   You experience panic attack symptoms that are different than your usual symptoms.  You have serious thoughts about hurting yourself or others.  You are  taking medicine for panic attacks and have a serious side effect. MAKE SURE YOU:  Understand these instructions.  Will watch your  condition.  Will get help right away if you are not doing well or get worse. Document Released: 05/02/2005 Document Revised: 05/07/2013 Document Reviewed: 12/14/2012 [facility] Patient Information 2015 [place], [place]. This information is not intended to replace advice given to you by your health care provider. Make sure you discuss any questions you have with your health care provider.

## 2014-12-27 NOTE — ED Notes (Signed)
MD at bedside. 

## 2014-12-27 NOTE — ED Provider Notes (Signed)
CSN: [id number]     Arrival date & time 12/27/14  0901 History   First MD Initiated Contact with Patient 12/27/14 [phone]     Chief Complaint  Patient presents with  . Dizziness     (Consider location/radiation/quality/duration/timing/severity/associated sxs/prior Treatment) HPI    Patient is a pleasant 39 year old female with past medical history significant for depression and anxiety. [facility] and had recent blood work including thyroid studies. Patient states that for the last 6-7 week she's been having episodes where she has her heart racing she feels short of breath and heavy limbs. She is in the room with her boyfriend who lives with her. He states that he believes that this is an anxiety attack. Patient has no chest pain associated with this. She had one this morning and since resolved.   Patient has no risk factors for PE.  Past Medical History  Diagnosis Date  . Depression   . Anxiety   . GERD (gastroesophageal reflux disease)   . Vitamin D deficiency    Past Surgical History  Procedure Laterality Date  . Dental surgery      8 years ago   Family History  Problem Relation Age of Onset  . Diabetes Mother   . Cancer Mother   . Diabetes Father    Social History  Substance Use Topics  . Smoking status: Current Every Day Smoker -- 0.50 packs/day    Types: Cigarettes  . Smokeless tobacco: Never Used  . Alcohol Use: No   OB History    No data available     Review of Systems  Constitutional: Negative for activity change and fatigue.  HENT: Negative for congestion and drooling.   Eyes: Negative for discharge.  Respiratory: Negative for cough and chest tightness.   Cardiovascular: Negative for chest pain.  Gastrointestinal: Negative for abdominal distention.  Genitourinary: Negative for dysuria and difficulty urinating.  Musculoskeletal: Negative for joint swelling.  Skin: Negative for rash.  Allergic/Immunologic: Negative for  immunocompromised state.  Neurological: Negative for seizures and speech difficulty.  Psychiatric/Behavioral: Negative for behavioral problems and agitation.      Allergies  Review of patient's allergies indicates no known allergies.  Home Medications   Prior to Admission medications   Medication Sig Start Date End Date Taking? Authorizing Provider  acetaminophen (TYLENOL) 325 MG tablet Take 650 mg by mouth every 6 (six) hours as needed for mild pain.   Yes Historical Provider, MD  amLODipine (NORVASC) 5 MG tablet Take 1 tablet (5 mg total) by mouth daily. 12/12/14  Yes Ambrose Finland, NP  hydrOXYzine (ATARAX/VISTARIL) 25 MG tablet Take 1 tablet (25 mg total) by mouth 2 (two) times daily as needed. 12/12/14  Yes Ambrose Finland, NP  omeprazole (PRILOSEC) 20 MG capsule Take 1 capsule (20 mg total) by mouth 2 (two) times daily before a meal. 08/27/14  Yes Ambrose Finland, NP  nitrofurantoin, macrocrystal-monohydrate, (MACROBID) 100 MG capsule Take 1 capsule (100 mg total) by mouth 2 (two) times daily. X 3 days Patient not taking: Reported on 12/12/2014 07/14/14   April Palumbo, MD   BP 171/95 mmHg  Pulse 100  Temp(Src) 98.3 F (36.8 C) (Oral)  Resp 18  SpO2 100%  LMP 12/27/2014 Physical Exam  Constitutional: She is oriented to person, place, and time. She appears well-developed and well-nourished.  Obese  HENT:  Head: Normocephalic and atraumatic.  No goiter  Eyes: Conjunctivae are normal. Right eye exhibits no discharge.  Neck:  Neck supple.  Cardiovascular: Normal rate, regular rhythm and normal heart sounds.   No murmur heard. Pulmonary/Chest: Effort normal and breath sounds normal. She has no wheezes. She has no rales.  Abdominal: Soft. She exhibits no distension. There is no tenderness.  Musculoskeletal: Normal range of motion. She exhibits no edema.  Equal leg size  Neurological: She is oriented to person, place, and time. No cranial nerve deficit.  Skin: Skin is warm and dry.  No rash noted. She is not diaphoretic.  Psychiatric: She has a normal mood and affect. Her behavior is normal.  Nursing note and vitals reviewed.   ED Course  Procedures (including critical care time) Labs Review Labs Reviewed  PREGNANCY, URINE  BASIC METABOLIC PANEL  URINALYSIS, ROUTINE W REFLEX MICROSCOPIC (NOT AT [facility])  CBC  CBG MONITORING, ED    Imaging Review No results found. I, Deshae Dickison L Keyera Hattabaugh, personally reviewed and evaluated these images and lab results as part of my medical decision-making.   EKG Interpretation None       EKG rate 96 NSR. No acute ischemia. NO change since most recent tracing.   MDM   Final diagnoses:  None    Patient is a 39 year old female presenting today with episodes of shortness of breath and arm heaviness. I think this likely is her presenting anxiety attacks. She's had difficulty with anxiety depression and is recently seen a therapist. Patient is PERC negative. Given occasional ongoing symptoms 1 troponin will be able rule out any cardiac etiology, in the setting of a normal EKG. Additionally patient has not had any chest pain associated with these attacks.  We will check electrolytes since patient recently started on new blood pressure medication.  If lab work normal, physical exam is normal, and will be able to discharge to follow up with primary care provider.    Jayonna Meyering Randall An, MD 12/27/14 1506

## 2015-06-22 ENCOUNTER — Other Ambulatory Visit: Payer: Self-pay | Admitting: Internal Medicine

## 2015-06-22 DIAGNOSIS — Z1231 Encounter for screening mammogram for malignant neoplasm of breast: Secondary | ICD-10-CM

## 2015-07-24 ENCOUNTER — Ambulatory Visit: Payer: Self-pay | Admitting: Internal Medicine

## 2016-01-17 ENCOUNTER — Emergency Department (HOSPITAL_BASED_OUTPATIENT_CLINIC_OR_DEPARTMENT_OTHER): Payer: Medicare Other

## 2016-01-17 ENCOUNTER — Emergency Department (HOSPITAL_BASED_OUTPATIENT_CLINIC_OR_DEPARTMENT_OTHER)
Admission: EM | Admit: 2016-01-17 | Discharge: 2016-01-17 | Disposition: A | Discharge status: Home / Self Care | Payer: Medicare Other | Attending: Emergency Medicine | Admitting: Emergency Medicine

## 2016-01-17 ENCOUNTER — Encounter (HOSPITAL_BASED_OUTPATIENT_CLINIC_OR_DEPARTMENT_OTHER): Payer: Self-pay | Admitting: *Deleted

## 2016-01-17 DIAGNOSIS — R0781 Pleurodynia: Secondary | ICD-10-CM

## 2016-01-17 DIAGNOSIS — E119 Type 2 diabetes mellitus without complications: Secondary | ICD-10-CM | POA: Insufficient documentation

## 2016-01-17 DIAGNOSIS — I10 Essential (primary) hypertension: Secondary | ICD-10-CM | POA: Insufficient documentation

## 2016-01-17 DIAGNOSIS — Z79899 Other long term (current) drug therapy: Secondary | ICD-10-CM | POA: Insufficient documentation

## 2016-01-17 DIAGNOSIS — Z87891 Personal history of nicotine dependence: Secondary | ICD-10-CM | POA: Diagnosis not present

## 2016-01-17 DIAGNOSIS — M549 Dorsalgia, unspecified: Secondary | ICD-10-CM | POA: Diagnosis present

## 2016-01-17 DIAGNOSIS — Z7984 Long term (current) use of oral hypoglycemic drugs: Secondary | ICD-10-CM | POA: Diagnosis not present

## 2016-01-17 DIAGNOSIS — Z791 Long term (current) use of non-steroidal anti-inflammatories (NSAID): Secondary | ICD-10-CM | POA: Diagnosis not present

## 2016-01-17 HISTORY — DX: Pure hypercholesterolemia, unspecified: E78.00

## 2016-01-17 HISTORY — DX: Essential (primary) hypertension: I10

## 2016-01-17 HISTORY — DX: Type 2 diabetes mellitus without complications: E11.9

## 2016-01-17 MED ORDER — IBUPROFEN 800 MG PO TABS
800.0000 mg | ORAL_TABLET | Freq: Once | ORAL | Status: AC
  Administered 2016-01-17: 800 mg via ORAL
  Filled 2016-01-17: qty 1

## 2016-01-17 MED ORDER — TRAMADOL HCL 50 MG PO TABS: 50.0000 mg | ORAL_TABLET | Freq: Four times a day (QID) | ORAL | 0 refills | Status: DC | PRN

## 2016-01-17 MED ORDER — METHOCARBAMOL 500 MG PO TABS
500.0000 mg | ORAL_TABLET | Freq: Once | ORAL | Status: AC
Start: 2016-01-17 — End: 2016-01-17
  Administered 2016-01-17: 500 mg via ORAL
  Filled 2016-01-17: qty 1

## 2016-01-17 MED ORDER — METHOCARBAMOL 500 MG PO TABS: 500.0000 mg | ORAL_TABLET | Freq: Two times a day (BID) | ORAL | 0 refills | Status: DC

## 2016-01-17 NOTE — ED Provider Notes (Signed)
[facility] Provider Note   CSN: [id number] Arrival date & time: 01/17/16  0759     History   Chief Complaint Chief Complaint  Patient presents with  . Back Pain    HPI Patricia Calderon is a 40 y.o. female.  The patient is a 40 year old female, she has no prior history of cancer, she was a prior smoker until 2 months ago, she denies any risk factors for pulmonary embolism. She states that she awoke this morning with pain in her left posterior lateral side over her lower ribs that is worse with movement, worse with deep breathing and worse with palpation. She has never had this pain before. It has gotten slightly worse as the day has gone on. She denies feeling short of breath, denies fevers or coughing and denies any anterior chest pain or right sided hemithorax symptoms. She has had no medications prior to arrival.   The history is provided by the patient.  Back Pain   This is a new problem. The current episode started 1 to 2 hours ago. The problem has been gradually worsening. The pain is associated with no known injury. The quality of the pain is described as aching. The pain does not radiate. The pain is moderate. The symptoms are aggravated by certain positions. Pertinent negatives include no fever, no numbness, no leg pain, no paresthesias and no tingling. She has tried nothing for the symptoms. Risk factors include obesity.    Past Medical History:  Diagnosis Date  . Anxiety   . Depression   . Diabetes mellitus without complication (HCC)   . GERD (gastroesophageal reflux disease)   . Hypercholesteremia   . Hypertension   . Vitamin D deficiency     Patient Active Problem List   Diagnosis Date Noted  . Esophageal reflux 08/31/2014    Past Surgical History:  Procedure Laterality Date  . DENTAL SURGERY     8 years ago    OB History    No data available       Home Medications    Prior to Admission medications   Medication Sig Start Date End Date  Taking? Authorizing Provider  acetaminophen (TYLENOL) 325 MG tablet Take 650 mg by mouth every 6 (six) hours as needed for mild pain.   Yes Historical Provider, MD  buPROPion (WELLBUTRIN XL) 150 MG 24 hr tablet Take 150 mg by mouth daily.   Yes Historical Provider, MD  hydrochlorothiazide (MICROZIDE) 12.5 MG capsule Take 12.5 mg by mouth daily.   Yes Historical Provider, MD  ibuprofen (ADVIL,MOTRIN) 800 MG tablet Take 800 mg by mouth daily as needed.   Yes Historical Provider, MD  metFORMIN (GLUCOPHAGE-XR) 500 MG 24 hr tablet Take 500 mg by mouth daily with breakfast.   Yes Historical Provider, MD  omeprazole (PRILOSEC) 20 MG capsule Take 1 capsule (20 mg total) by mouth 2 (two) times daily before a meal. 08/27/14  Yes Ambrose Finland, NP  sertraline (ZOLOFT) 100 MG tablet Take 200 mg by mouth daily.   Yes Historical Provider, MD  simvastatin (ZOCOR) 5 MG tablet Take 5 mg by mouth daily.   Yes Historical Provider, MD  methocarbamol (ROBAXIN) 500 MG tablet Take 1 tablet (500 mg total) by mouth 2 (two) times daily. 01/17/16   Eber Hong, MD  traMADol (ULTRAM) 50 MG tablet Take 1 tablet (50 mg total) by mouth every 6 (six) hours as needed. 01/17/16   Eber Hong, MD    Family History Family History  Problem Relation Age of Onset  . Diabetes Mother   . Cancer Mother   . Diabetes Father     Social History Social History  Substance Use Topics  . Smoking status: Former Smoker    Packs/day: 0.50    Types: Cigarettes  . Smokeless tobacco: Never Used  . Alcohol use No     Allergies   Review of patient's allergies indicates no known allergies.   Review of Systems Review of Systems  Constitutional: Negative for fever.  Musculoskeletal: Positive for back pain.  Neurological: Negative for tingling, numbness and paresthesias.  All other systems reviewed and are negative.    Physical Exam Updated Vital Signs BP (!) 179/119 (BP Location: Left Arm)   Pulse 101   Temp 98.3 F (36.8 C)  (Oral)   Ht 5\' 6"  (1.676 m)   Wt (!) 355 lb (161 kg)   LMP 01/05/2016 (Exact Date)   SpO2 99%   BMI 57.30 kg/m   Physical Exam  Constitutional: She appears well-developed and well-nourished. No distress.  HENT:  Head: Normocephalic and atraumatic.  Mouth/Throat: Oropharynx is clear and moist. No oropharyngeal exudate.  Eyes: Conjunctivae and EOM are normal. Pupils are equal, round, and reactive to light. Right eye exhibits no discharge. Left eye exhibits no discharge. No scleral icterus.  Neck: Normal range of motion. Neck supple. No JVD present. No thyromegaly present.  Cardiovascular: Normal rate, regular rhythm, normal heart sounds and intact distal pulses.  Exam reveals no gallop and no friction rub.   No murmur heard. Pulmonary/Chest: Effort normal and breath sounds normal. No respiratory distress. She has no wheezes. She has no rales.  Musculoskeletal: Normal range of motion. She exhibits tenderness. She exhibits no edema.  No edema of the legs - no asymetry - normal gait - has ttp over the L lower ribs and postero-lateral ribs.  Pain with breathing.  Lymphadenopathy:    She has no cervical adenopathy.  Neurological: She is alert. Coordination normal.  Skin: Skin is warm and dry. No rash ( no dermatomal rash on the L) noted. No erythema.  Psychiatric: She has a normal mood and affect. Her behavior is normal.  Nursing note and vitals reviewed.    ED Treatments / Results  Labs (all labs ordered are listed, but only abnormal results are displayed) Labs Reviewed - No data to display  EKG  EKG Interpretation None       Radiology Dg Ribs Unilateral W/chest Left  Result Date: 01/17/2016 CLINICAL DATA:  Left posterior rib pain since this morning EXAM: LEFT RIBS AND CHEST - 3+ VIEW COMPARISON:  December 27, 2014 FINDINGS: No fracture or other bone lesions are seen involving the ribs. There is no evidence of pneumothorax or pleural effusion. Both lungs are clear. Heart size and  mediastinal contours are within normal limits. IMPRESSION: No acute fracture or dislocation of left ribs. Electronically Signed   By: Sherian ReinWei-Chen  Lin M.D.   On: 01/17/2016 08:43    Procedures Procedures (including critical care time)  Medications Ordered in ED Medications  ibuprofen (ADVIL,MOTRIN) tablet 800 mg (800 mg Oral Given 01/17/16 0838)  methocarbamol (ROBAXIN) tablet 500 mg (500 mg Oral Given 01/17/16 0901)     Initial Impression / Assessment and Plan / ED Course  I have reviewed the triage vital signs and the nursing notes.  Pertinent labs & imaging results that were available during my care of the patient were reviewed by me and considered in my medical decision making (see chart  for details).  Clinical Course    Pain - likely muscular - rib xray, motrin, robaxin, pt agreeable - low risk PE / PNA / PTX Xray negative for rib fractures / lung abnormalities.  Final Clinical Impressions(s) / ED Diagnoses   Final diagnoses:  Rib pain on left side    New Prescriptions New Prescriptions   METHOCARBAMOL (ROBAXIN) 500 MG TABLET    Take 1 tablet (500 mg total) by mouth 2 (two) times daily.   TRAMADOL (ULTRAM) 50 MG TABLET    Take 1 tablet (50 mg total) by mouth every 6 (six) hours as needed.     Eber Hong, MD 01/17/16 [phone]

## 2016-01-17 NOTE — Discharge Instructions (Signed)
Xray is normal - no rib / lung abnormalities Seek medical attention for severe or worsening pain / vomiting / fever / cough or difficulty breathing  Please obtain all of your results from medical records or have your doctors office obtain the results - share them with your doctor - you should be seen at your doctors office in the next 2 days. Call today to arrange your follow up. Take the medications as prescribed. Please review all of the medicines and only take them if you do not have an allergy to them. Please be aware that if you are taking birth control pills, taking other prescriptions, ESPECIALLY ANTIBIOTICS may make the birth control ineffective - if this is the case, either do not engage in sexual activity or use alternative methods of birth control such as condoms until you have finished the medicine and your family doctor says it is OK to restart them. If you are on a blood thinner such as COUMADIN, be aware that any other medicine that you take may cause the coumadin to either work too much, or not enough - you should have your coumadin level rechecked in next 7 days if this is the case.  ?  It is also a possibility that you have an allergic reaction to any of the medicines that you have been prescribed - Everybody reacts differently to medications and while MOST people have no trouble with most medicines, you may have a reaction such as nausea, vomiting, rash, swelling, shortness of breath. If this is the case, please stop taking the medicine immediately and contact your physician.  ?  You should return to the ER if you develop severe or worsening symptoms.

## 2016-01-17 NOTE — ED Notes (Signed)
MD at bedside. 

## 2016-01-17 NOTE — ED Triage Notes (Signed)
Pt reports muscle tenderness at L mid-back when she woke up this morning. Reports it worsened suddenly a short time later. Denies sob, chest pain.

## 2016-05-16 DIAGNOSIS — Z9889 Other specified postprocedural states: Secondary | ICD-10-CM

## 2016-05-16 HISTORY — DX: Other specified postprocedural states: Z98.890

## 2016-08-08 ENCOUNTER — Other Ambulatory Visit: Payer: Self-pay | Admitting: Physician Assistant

## 2016-08-08 ENCOUNTER — Other Ambulatory Visit (HOSPITAL_COMMUNITY): Payer: Self-pay | Admitting: Physician Assistant

## 2016-08-08 DIAGNOSIS — R609 Edema, unspecified: Secondary | ICD-10-CM

## 2016-08-08 DIAGNOSIS — R600 Localized edema: Secondary | ICD-10-CM

## 2016-08-09 ENCOUNTER — Ambulatory Visit (HOSPITAL_COMMUNITY): Admission: RE | Admit: 2016-08-09 | Payer: Medicare Other | Source: Ambulatory Visit

## 2016-08-11 ENCOUNTER — Ambulatory Visit (HOSPITAL_COMMUNITY): Payer: Medicare Other

## 2016-08-16 ENCOUNTER — Ambulatory Visit (HOSPITAL_COMMUNITY): Admission: RE | Admit: 2016-08-16 | Payer: Medicare Other | Source: Ambulatory Visit

## 2017-07-24 ENCOUNTER — Encounter (HOSPITAL_COMMUNITY): Payer: Self-pay | Admitting: Emergency Medicine

## 2017-07-24 ENCOUNTER — Other Ambulatory Visit: Payer: Self-pay

## 2017-07-24 ENCOUNTER — Ambulatory Visit (HOSPITAL_COMMUNITY)
Admission: EM | Admit: 2017-07-24 | Discharge: 2017-07-24 | Disposition: A | Discharge status: Home / Self Care | Payer: Medicare Other | Attending: Family Medicine | Admitting: Family Medicine

## 2017-07-24 DIAGNOSIS — J069 Acute upper respiratory infection, unspecified: Secondary | ICD-10-CM

## 2017-07-24 MED ORDER — FLUTICASONE PROPIONATE 50 MCG/ACT NA SUSP: 1.0000 | Freq: Every day | NASAL | 2 refills | Status: AC

## 2017-07-24 MED ORDER — AMOXICILLIN-POT CLAVULANATE 875-125 MG PO TABS: 1.0000 | ORAL_TABLET | Freq: Two times a day (BID) | ORAL | 0 refills | Status: AC

## 2017-07-24 MED ORDER — SALINE SPRAY 0.65 % NA SOLN: 1.0000 | NASAL | 0 refills | Status: AC | PRN

## 2017-07-24 NOTE — ED Provider Notes (Signed)
[facility]    CSN: [id number] Arrival date & time: 07/24/17  1450     History   Chief Complaint Chief Complaint  Patient presents with  . URI    HPI Patricia Calderon is a 42 y.o. female.   Manessa presents with her brother with complaints of sinus congestion with sore throat, post nasal drip, facial pressure, ear pressure and cough related to drainage, which started two days ago. Her brother whom she lives with is also ill with similar illness. No fevers. Denies gi/gu complaints. Has been taking ibuprofen which has minimally helped with symptoms, last at 0900 this morning. Mildly dizzy. History of anxiety, depression, dm, gerd, htn.    ROS per HPI.       Past Medical History:  Diagnosis Date  . Anxiety   . Depression   . Diabetes mellitus without complication (HCC)   . GERD (gastroesophageal reflux disease)   . Hypercholesteremia   . Hypertension   . Vitamin D deficiency     Patient Active Problem List   Diagnosis Date Noted  . Esophageal reflux 08/31/2014    Past Surgical History:  Procedure Laterality Date  . DENTAL SURGERY     8 years ago    OB History    No data available       Home Medications    Prior to Admission medications   Medication Sig Start Date End Date Taking? Authorizing Provider  clonazePAM (KLONOPIN) 0.5 MG tablet Take 0.5 mg by mouth 2 (two) times daily as needed for anxiety.   Yes [provider]  gabapentin (NEURONTIN) 300 MG capsule Take 300 mg by mouth 3 (three) times daily.   Yes [provider]  glipiZIDE (GLUCOTROL) 5 MG tablet Take by mouth daily before breakfast.   Yes [provider]  pantoprazole (PROTONIX) 40 MG tablet Take 40 mg by mouth daily.   Yes [provider]  acetaminophen (TYLENOL) 325 MG tablet Take 650 mg by mouth every 6 (six) hours as needed for mild pain.    [provider]  amoxicillin-clavulanate (AUGMENTIN) 875-125 MG tablet Take 1 tablet by  mouth every 12 (twelve) hours for 10 days. 07/28/17 08/07/17  Georgetta Haber, NP  fluticasone (FLONASE) 50 MCG/ACT nasal spray Place 1 spray into both nostrils daily. 07/24/17   Georgetta Haber, NP  hydrochlorothiazide (MICROZIDE) 12.5 MG capsule Take 25 mg by mouth daily.     [provider]  ibuprofen (ADVIL,MOTRIN) 800 MG tablet Take 800 mg by mouth daily as needed.    [provider]  metFORMIN (GLUCOPHAGE-XR) 500 MG 24 hr tablet Take 1,000 mg by mouth 2 (two) times daily.     [provider]  simvastatin (ZOCOR) 5 MG tablet Take 5 mg by mouth daily.    [provider]  sodium chloride (OCEAN) 0.65 % SOLN nasal spray Place 1 spray into both nostrils as needed for congestion. 07/24/17   Georgetta Haber, NP    Family History Family History  Problem Relation Age of Onset  . Diabetes Mother   . Cancer Mother   . Diabetes Father     Social History Social History   Tobacco Use  . Smoking status: Former Smoker    Packs/day: 0.50    Types: Cigarettes  . Smokeless tobacco: Never Used  Substance Use Topics  . Alcohol use: No  . Drug use: No     Allergies   Patient has no known allergies.   Review  of Systems Review of Systems   Physical Exam Triage Vital Signs ED Triage Vitals  Enc Vitals Group     BP 07/24/17 1556 122/79     Pulse Rate 07/24/17 1556 96     Resp 07/24/17 1556 (!) 22     Temp 07/24/17 1556 98.1 F (36.7 C)     Temp src --      SpO2 07/24/17 1556 100 %     Weight --      Height --      Head Circumference --      Peak Flow --      Pain Score 07/24/17 1549 5     Pain Loc --      Pain Edu? --      Excl. in GC? --    No data found.  Updated Vital Signs BP 122/79 (BP Location: Left Arm) Comment (BP Location): regular cuff, forearm  Pulse 96   Temp 98.1 F (36.7 C)   Resp (!) 22   SpO2 100%   Visual Acuity Right Eye Distance:   Left Eye Distance:   Bilateral Distance:    Right Eye Near:   Left Eye Near:     Bilateral Near:     Physical Exam  Constitutional: She is oriented to person, place, and time. She appears well-developed and well-nourished. No distress.  HENT:  Head: Normocephalic and atraumatic.  Right Ear: Tympanic membrane, external ear and ear canal normal.  Left Ear: Tympanic membrane, external ear and ear canal normal.  Nose: Rhinorrhea present. Right sinus exhibits maxillary sinus tenderness and frontal sinus tenderness. Left sinus exhibits maxillary sinus tenderness and frontal sinus tenderness.  Mouth/Throat: Uvula is midline, oropharynx is clear and moist and mucous membranes are normal. No tonsillar exudate.  Eyes: Conjunctivae and EOM are normal. Pupils are equal, round, and reactive to light.  Cardiovascular: Normal rate, regular rhythm and normal heart sounds.  Pulmonary/Chest: Effort normal and breath sounds normal.  Lymphadenopathy:    She has no cervical adenopathy.  Neurological: She is alert and oriented to person, place, and time.  Skin: Skin is warm and dry.     UC Treatments / Results  Labs (all labs ordered are listed, but only abnormal results are displayed) Labs Reviewed - No data to display  EKG  EKG Interpretation None       Radiology No results found.  Procedures Procedures (including critical care time)  Medications Ordered in UC Medications - No data to display   Initial Impression / Assessment and Plan / UC Course  I have reviewed the triage vital signs and the nursing notes.  Pertinent labs & imaging results that were available during my care of the patient were reviewed by me and considered in my medical decision making (see chart for details).     Non toxic in appearance. Vitals stable. Afbrile. Third day of URI symptoms. History and physical consistent with viral illness.  Supportive cares recommended. Antibiotics provided for as needed use if symptoms persist, may fill 3/15. Patient verbalized understanding and agreeable to  plan.    Final Clinical Impressions(s) / UC Diagnoses   Final diagnoses:  Upper respiratory tract infection, unspecified type    ED Discharge Orders        Ordered    amoxicillin-clavulanate (AUGMENTIN) 875-125 MG tablet  Every 12 hours     07/24/17 1629    fluticasone (FLONASE) 50 MCG/ACT nasal spray  Daily     07/24/17 1629  sodium chloride (OCEAN) 0.65 % SOLN nasal spray  As needed     07/24/17 1629       Controlled Substance Prescriptions [place] Controlled Substance Registry consulted? Not Applicable   Georgetta HaberBurky, Kahlen Boyde B, NP 07/24/17 1635

## 2017-07-24 NOTE — ED Triage Notes (Signed)
Onset of symptoms 2 days ago.  Symptoms include sinus pain and pressure, sore throat, nasal congestion and drainage and cough.

## 2017-07-24 NOTE — Discharge Instructions (Signed)
Push fluids to ensure adequate hydration and keep secretions thin.  Tylenol and/or ibuprofen as needed for pain or fevers.  Daily flonase. Nasal saline throughout the day to promote drainage. Mucinex as an expectorant may be helpful as well. May start course of antibiotics 3/15 if persistent or worsening symptoms.

## 2017-11-16 ENCOUNTER — Ambulatory Visit (HOSPITAL_COMMUNITY)
Admission: EM | Admit: 2017-11-16 | Discharge: 2017-11-16 | Disposition: A | Discharge status: Home / Self Care | Payer: Medicare Other | Attending: Family Medicine | Admitting: Family Medicine

## 2017-11-16 ENCOUNTER — Encounter (HOSPITAL_COMMUNITY): Payer: Self-pay

## 2017-11-16 DIAGNOSIS — M542 Cervicalgia: Secondary | ICD-10-CM | POA: Diagnosis not present

## 2017-11-16 DIAGNOSIS — R6884 Jaw pain: Secondary | ICD-10-CM | POA: Diagnosis not present

## 2017-11-16 MED ORDER — KETOROLAC TROMETHAMINE 60 MG/2ML IM SOLN
INTRAMUSCULAR | Status: AC
  Filled 2017-11-16: qty 2

## 2017-11-16 MED ORDER — METHYLPREDNISOLONE SODIUM SUCC 125 MG IJ SOLR
INTRAMUSCULAR | Status: AC
  Filled 2017-11-16: qty 2

## 2017-11-16 MED ORDER — KETOROLAC TROMETHAMINE 60 MG/2ML IM SOLN
60.0000 mg | Freq: Once | INTRAMUSCULAR | Status: AC
  Administered 2017-11-16: 60 mg via INTRAMUSCULAR

## 2017-11-16 MED ORDER — METHYLPREDNISOLONE SODIUM SUCC 125 MG IJ SOLR
125.0000 mg | Freq: Once | INTRAMUSCULAR | Status: AC
  Administered 2017-11-16: 125 mg via INTRAMUSCULAR

## 2017-11-16 NOTE — ED Triage Notes (Signed)
Pt presents with right side neck and jaw pain

## 2017-11-16 NOTE — ED Notes (Signed)
Bed: UC01 Expected date: 11/16/17 Expected time:  Means of arrival:  Comments: For APPTS

## 2017-11-16 NOTE — Discharge Instructions (Signed)
You were given a steroid and toradol injection in office today. Please restart Mobic and finish the course.  Do not take ibuprofen (motrin/advil)/ naproxen (aleve) while on mobic. Continue roboxin as needed. Heat compresses as needed. This can take up to 3-4 weeks to completely resolve, but you should be feeling better each week. Follow up here or with PCP if symptoms worsen, changes for reevaluation. Please monitor for radiation of pain, numbness/tingling of the fingers, follow up for reevaluation needed.

## 2017-11-16 NOTE — ED Provider Notes (Signed)
[facility]    CSN: [id number] Arrival date & time: 11/16/17  1556     History   Chief Complaint Chief Complaint  Patient presents with  . Torticollis    HPI Patricia Calderon is a 42 y.o. female.   42 year old female with history of DM, HTN, HLD comes in for 2 day history of pain to the right neck and behind the ear. Denies injury/trauma. However, did state that has recently increased exercising. States pain is constant, dull/steady pain that is worse with movement and palpation. She denies fever, chills, night sweats. Denies URI symptoms such as cough, congestion, sore throat. Denies trouble breathing, trouble swallowing, swelling of the throat, drooling, trismus. Denies radiation of pain, numbness/tingling. She was seen by PCP for right hip pain for which she is taking robaxin with good relief of hip, no relief to the neck. She tried 2 days of mobic and discontinued as it did not seem to help, went back to ibuprofen without relief.           Past Medical History:  Diagnosis Date  . Anxiety   . Depression   . Diabetes mellitus without complication (HCC)   . GERD (gastroesophageal reflux disease)   . Hypercholesteremia   . Hypertension   . Vitamin D deficiency     Patient Active Problem List   Diagnosis Date Noted  . Esophageal reflux 08/31/2014    Past Surgical History:  Procedure Laterality Date  . DENTAL SURGERY     8 years ago    OB History   None      Home Medications    Prior to Admission medications   Medication Sig Start Date End Date Taking? Authorizing Provider  acetaminophen (TYLENOL) 325 MG tablet Take 650 mg by mouth every 6 (six) hours as needed for mild pain.    [provider]  clonazePAM (KLONOPIN) 0.5 MG tablet Take 0.5 mg by mouth 2 (two) times daily as needed for anxiety.    [provider]  fluticasone (FLONASE) 50 MCG/ACT nasal spray Place 1 spray into both nostrils daily. 07/24/17   Georgetta Haber, NP    gabapentin (NEURONTIN) 300 MG capsule Take 300 mg by mouth 3 (three) times daily.    [provider]  glipiZIDE (GLUCOTROL) 5 MG tablet Take by mouth daily before breakfast.    [provider]  hydrochlorothiazide (MICROZIDE) 12.5 MG capsule Take 25 mg by mouth daily.     [provider]  ibuprofen (ADVIL,MOTRIN) 800 MG tablet Take 800 mg by mouth daily as needed.    [provider]  metFORMIN (GLUCOPHAGE-XR) 500 MG 24 hr tablet Take 1,000 mg by mouth 2 (two) times daily.     [provider]  pantoprazole (PROTONIX) 40 MG tablet Take 40 mg by mouth daily.    [provider]  simvastatin (ZOCOR) 5 MG tablet Take 5 mg by mouth daily.    [provider]  sodium chloride (OCEAN) 0.65 % SOLN nasal spray Place 1 spray into both nostrils as needed for congestion. 07/24/17   Georgetta Haber, NP    Family History Family History  Problem Relation Age of Onset  . Diabetes Mother   . Cancer Mother   . Diabetes Father     Social History Social History   Tobacco Use  . Smoking status: Former Smoker    Packs/day: 0.50    Types: Cigarettes  . Smokeless tobacco: Never Used  Substance Use Topics  .  Alcohol use: No  . Drug use: No     Allergies   Patient has no known allergies.   Review of Systems Review of Systems  Reason unable to perform ROS: See HPI as above.     Physical Exam Triage Vital Signs ED Triage Vitals  Enc Vitals Group     BP 11/16/17 1603 (!) 128/97     Pulse Rate 11/16/17 1603 96     Resp 11/16/17 1603 20     Temp 11/16/17 1603 98.3 F (36.8 C)     Temp Source 11/16/17 1603 Oral     SpO2 11/16/17 1603 96 %     Weight --      Height --      Head Circumference --      Peak Flow --      Pain Score 11/16/17 1601 6     Pain Loc --      Pain Edu? --      Excl. in GC? --    No data found.  Updated Vital Signs BP (!) 128/97 (BP Location: Right Arm)   Pulse 96   Temp 98.3 F (36.8 C) (Oral)    Resp 20   LMP 10/20/2017   SpO2 96%   Physical Exam  Constitutional: She is oriented to person, place, and time. She appears well-developed and well-nourished. No distress.  HENT:  Head: Normocephalic and atraumatic.  Right Ear: Tympanic membrane, external ear and ear canal normal. Tympanic membrane is not erythematous and not bulging.  Left Ear: Tympanic membrane, external ear and ear canal normal. Tympanic membrane is not erythematous and not bulging.  Nose: Nose normal. Right sinus exhibits no maxillary sinus tenderness and no frontal sinus tenderness. Left sinus exhibits no maxillary sinus tenderness and no frontal sinus tenderness.  Mouth/Throat: Uvula is midline, oropharynx is clear and moist and mucous membranes are normal. No trismus in the jaw.  No tenderness to palpation of the gums. Floor of mouth soft to palpation.   Eyes: Pupils are equal, round, and reactive to light. Conjunctivae are normal.  Neck: Normal range of motion. Neck supple.  No swelling, erythema, increased warmth, contusion seen. Tenderness to palpation along the right neck to the occipital area. No tenderness to palpation of spinous processes. Full ROM of neck. Strength normal and equal bilaterally.   Cardiovascular: Normal rate, regular rhythm and normal heart sounds. Exam reveals no gallop and no friction rub.  No murmur heard. Pulmonary/Chest: Effort normal and breath sounds normal. She has no decreased breath sounds. She has no wheezes. She has no rhonchi. She has no rales.  Musculoskeletal:  No tenderness to palpation of the shoulders. Full ROM. Strength normal and equal bilaterally. Sensation intact and equal bilaterally. Radial pulse 2+, cap refill <2s  Lymphadenopathy:    She has no cervical adenopathy.  Neurological: She is alert and oriented to person, place, and time.  Skin: Skin is warm and dry.  Psychiatric: She has a normal mood and affect. Her behavior is normal. Judgment normal.     UC  Treatments / Results  Labs (all labs ordered are listed, but only abnormal results are displayed) Labs Reviewed - No data to display  EKG None  Radiology No results found.  Procedures Procedures (including critical care time)  Medications Ordered in UC Medications  ketorolac (TORADOL) injection 60 mg (60 mg Intramuscular Given 11/16/17 1638)  methylPREDNISolone sodium succinate (SOLU-MEDROL) 125 mg/2 mL injection 125 mg (125 mg Intramuscular Given 11/16/17 1638)  Initial Impression / Assessment and Plan / UC Course  I have reviewed the triage vital signs and the nursing notes.  Pertinent labs & imaging results that were available during my care of the patient were reviewed by me and considered in my medical decision making (see chart for details).    No alarming signs on exam.  Given history and exam, most suspicious for MSK causes of symptoms.  Toradol  and Solu-Medrol injection in office today.  Patient to restart Mobic, can continue Robaxin for further symptom relief.  Ice/heat compress.  Return precautions given.  Patient expresses understanding and agrees to plan.  Final Clinical Impressions(s) / UC Diagnoses   Final diagnoses:  Neck pain on right side    ED Prescriptions    None        Belinda Fisher, PA-C 11/16/17 1752

## 2017-11-23 ENCOUNTER — Other Ambulatory Visit: Payer: Self-pay | Admitting: Physician Assistant

## 2017-11-23 ENCOUNTER — Ambulatory Visit
Admission: RE | Admit: 2017-11-23 | Discharge: 2017-11-23 | Disposition: A | Discharge status: Home / Self Care | Payer: Medicare Other | Source: Ambulatory Visit | Attending: Physician Assistant | Admitting: Physician Assistant

## 2017-11-23 DIAGNOSIS — R52 Pain, unspecified: Secondary | ICD-10-CM

## 2018-02-05 ENCOUNTER — Emergency Department (HOSPITAL_COMMUNITY)
Admission: EM | Admit: 2018-02-05 | Discharge: 2018-02-06 | Disposition: A | Discharge status: Home / Self Care | Payer: Medicare Other | Attending: Emergency Medicine | Admitting: Emergency Medicine

## 2018-02-05 ENCOUNTER — Other Ambulatory Visit: Payer: Self-pay

## 2018-02-05 ENCOUNTER — Emergency Department (HOSPITAL_COMMUNITY): Payer: Medicare Other

## 2018-02-05 ENCOUNTER — Encounter (HOSPITAL_COMMUNITY): Payer: Self-pay

## 2018-02-05 DIAGNOSIS — E119 Type 2 diabetes mellitus without complications: Secondary | ICD-10-CM | POA: Insufficient documentation

## 2018-02-05 DIAGNOSIS — Z7984 Long term (current) use of oral hypoglycemic drugs: Secondary | ICD-10-CM | POA: Diagnosis not present

## 2018-02-05 DIAGNOSIS — I10 Essential (primary) hypertension: Secondary | ICD-10-CM | POA: Diagnosis not present

## 2018-02-05 DIAGNOSIS — R0789 Other chest pain: Secondary | ICD-10-CM | POA: Insufficient documentation

## 2018-02-05 DIAGNOSIS — Z87891 Personal history of nicotine dependence: Secondary | ICD-10-CM | POA: Insufficient documentation

## 2018-02-05 DIAGNOSIS — Z79899 Other long term (current) drug therapy: Secondary | ICD-10-CM | POA: Diagnosis not present

## 2018-02-05 DIAGNOSIS — R002 Palpitations: Secondary | ICD-10-CM | POA: Insufficient documentation

## 2018-02-05 LAB — CBC
HEMATOCRIT: 38.1 % (ref 36.0–46.0)
HEMOGLOBIN: 11.9 g/dL — AB (ref 12.0–15.0)
MCH: 28.8 pg (ref 26.0–34.0)
MCHC: 31.2 g/dL (ref 30.0–36.0)
MCV: 92.3 fL (ref 78.0–100.0)
Platelets: 285 10*3/uL (ref 150–400)
RBC: 4.13 MIL/uL (ref 3.87–5.11)
RDW: 14.1 % (ref 11.5–15.5)
WBC: 7.8 10*3/uL (ref 4.0–10.5)

## 2018-02-05 LAB — I-STAT BETA HCG BLOOD, ED (MC, WL, AP ONLY): I-stat hCG, quantitative: <5 m[IU]/mL (ref <5)

## 2018-02-05 LAB — BASIC METABOLIC PANEL
ANION GAP: 12 (ref 5–15)
BUN: 12 mg/dL (ref 6–20)
CHLORIDE: 103 mmol/L (ref 98–111)
CO2: 24 mmol/L (ref 22–32)
Calcium: 9.6 mg/dL (ref 8.9–10.3)
Creatinine, Ser: 0.75 mg/dL (ref 0.44–1.00)
GFR calc Af Amer: >60 mL/min (ref >60)
GLUCOSE: 136 mg/dL — AB (ref 70–99)
POTASSIUM: 3.5 mmol/L (ref 3.5–5.1)
SODIUM: 139 mmol/L (ref 135–145)

## 2018-02-05 LAB — I-STAT TROPONIN, ED: Troponin i, poc: 0 ng/mL (ref 0.00–0.08)

## 2018-02-05 NOTE — ED Triage Notes (Signed)
Pt here for chest pain that started at 6pm. EMS came to the house but pain resolved and then started again.  No cardiac hx.  Short of breath when walking.  Having left arm and shoulder pain now.

## 2018-02-05 NOTE — ED Provider Notes (Signed)
MSE was initiated and I personally evaluated the patient and placed orders (if any) at  7:32 PM on February 05, 2018.  The patient appears stable so that the remainder of the MSE may be completed by another provider.  Patient placed in Quick Look pathway, seen and evaluated   Chief Complaint: chest pain  HPI:   42yo female with a past medical history of hypertension, anxiety, depression presents for acute onset of central chest pain and shortness of breath.  Symptoms began at 6 PM when she was sitting at her desk.  Symptoms began gradually resolving after she called 911.  They gave her aspirin. Doesn't know if this helped.  She then called her brother who brought her to the ED.  Denies any pain currently.  Denies any shortness of breath, cough, hemoptysis.  She does state that she did not take her regularly scheduled Klonopin yesterday but she did take 1 dose today.  Denies any prior MI, PE, recent immobilization, hormone use or leg swelling.  ROS: chest pain (one)  Physical Exam:   Gen: No distress  Neuro: Awake and Alert  Skin: Warm    Focused Exam: anxious, no chest TTP, no abdominal TTP. RRR. Lungs CTAB.   Initiation of care has begun. The patient has been counseled on the process, plan, and necessity for staying for the completion/evaluation, and the remainder of the medical screening examination    Dietrich PatesKhatri, Patricia Buster, PA-C 02/05/18 Patricia Males1937    Knapp, Jon, MD 02/05/18 2330

## 2018-02-06 NOTE — ED Provider Notes (Signed)
[facility] EMERGENCY DEPARTMENT Provider Note   CSN: [id number] Arrival date & time: 02/05/18  1324     History   Chief Complaint Chief Complaint  Patient presents with  . Chest Pain    HPI Patricia Calderon is a 42 y.o. female.  The history is provided by the patient.  She has history of anxiety, depression, hypertension, diabetes, hyperlipidemia, GERD and comes in following an episode of chest discomfort and palpitations.  At about 6:30 PM, she noted that she felt hot and cold and felt that her heart was racing.  She checked her heart rate and it was over 140.  There was some associated heaviness in her chest.  This lasted between 10 and 20 minutes before resolving spontaneously.  She has not had any symptoms since then.  She has never had this happen before.  There was no associated dyspnea, nausea, diaphoresis.  Of note, she is scheduled to have bariatric surgery in the next several months.  She has had a 40 pound voluntary weight loss in the last several months.  She is a non-smoker.  Past Medical History:  Diagnosis Date  . Anxiety   . Depression   . Diabetes mellitus without complication (HCC)   . GERD (gastroesophageal reflux disease)   . Hypercholesteremia   . Hypertension   . Vitamin D deficiency     Patient Active Problem List   Diagnosis Date Noted  . Esophageal reflux 08/31/2014    Past Surgical History:  Procedure Laterality Date  . DENTAL SURGERY     8 years ago     OB History   None      Home Medications    Prior to Admission medications   Medication Sig Start Date End Date Taking? Authorizing Provider  acetaminophen (TYLENOL) 325 MG tablet Take 650 mg by mouth every 6 (six) hours as needed for mild pain.    [provider]  clonazePAM (KLONOPIN) 0.5 MG tablet Take 0.5 mg by mouth 2 (two) times daily as needed for anxiety.    [provider]  fluticasone (FLONASE) 50 MCG/ACT nasal spray Place 1 spray into both  nostrils daily. 07/24/17   Georgetta Haber, NP  gabapentin (NEURONTIN) 300 MG capsule Take 300 mg by mouth 3 (three) times daily.    [provider]  glipiZIDE (GLUCOTROL) 5 MG tablet Take by mouth daily before breakfast.    [provider]  hydrochlorothiazide (MICROZIDE) 12.5 MG capsule Take 25 mg by mouth daily.     [provider]  ibuprofen (ADVIL,MOTRIN) 800 MG tablet Take 800 mg by mouth daily as needed.    [provider]  metFORMIN (GLUCOPHAGE-XR) 500 MG 24 hr tablet Take 1,000 mg by mouth 2 (two) times daily.     [provider]  pantoprazole (PROTONIX) 40 MG tablet Take 40 mg by mouth daily.    [provider]  simvastatin (ZOCOR) 5 MG tablet Take 5 mg by mouth daily.    [provider]  sodium chloride (OCEAN) 0.65 % SOLN nasal spray Place 1 spray into both nostrils as needed for congestion. 07/24/17   Georgetta Haber, NP    Family History Family History  Problem Relation Age of Onset  . Diabetes Mother   . Cancer Mother   . Diabetes Father     Social History Social History   Tobacco Use  . Smoking status: Former Smoker    Packs/day: 0.50    Types: Cigarettes  .  Smokeless tobacco: Never Used  Substance Use Topics  . Alcohol use: No  . Drug use: No     Allergies   Patient has no known allergies.   Review of Systems Review of Systems  All other systems reviewed and are negative.    Physical Exam Updated Vital Signs BP 137/84 (BP Location: Right Arm)   Pulse 83   Temp 98.5 F (36.9 C) (Oral)   Resp 16   Ht 5\' 6"  (1.676 m)   Wt (!) 144.7 kg   LMP 01/22/2018   SpO2 99%   BMI 51.49 kg/m   Physical Exam  Nursing note and vitals reviewed.  Morbidly obese 42 year old female, resting comfortably and in no acute distress. Vital signs are normal. Oxygen saturation is 99%, which is normal. Head is normocephalic and atraumatic. PERRLA, EOMI. Oropharynx is clear. Neck is nontender and supple  without adenopathy or JVD. Back is nontender and there is no CVA tenderness. Lungs are clear without rales, wheezes, or rhonchi. Chest is nontender. Heart has regular rate and rhythm without murmur. Abdomen is soft, flat, nontender without masses or hepatosplenomegaly and peristalsis is normoactive. Extremities have no cyanosis or edema, full range of motion is present. Skin is warm and dry without rash. Neurologic: Mental status is normal, cranial nerves are intact, there are no motor or sensory deficits.  ED Treatments / Results  Labs (all labs ordered are listed, but only abnormal results are displayed) Labs Reviewed  BASIC METABOLIC PANEL - Abnormal; Notable for the following components:      Result Value   Glucose, Bld 136 (*)    All other components within normal limits  CBC - Abnormal; Notable for the following components:   Hemoglobin 11.9 (*)    All other components within normal limits  I-STAT TROPONIN, ED  I-STAT BETA HCG BLOOD, ED ([facility], [facility], [facility] ONLY)    EKG EKG Interpretation  Date/Time:  Monday February 05 2018 19:26:57 EDT Ventricular Rate:  85 PR Interval:  166 QRS Duration: 110 QT Interval:  402 QTC Calculation: 478 R Axis:   77 Text Interpretation:  Normal sinus rhythm Incomplete right bundle branch block Borderline ECG When compared with ECG of 12/27/2014, No significant change was found Confirmed by Dione BoozeGlick, Tawni Melkonian ([id number]) on 02/05/2018 11:02:25 PM   Radiology Dg Chest 2 View  Result Date: 02/05/2018 CLINICAL DATA:  Chest pain EXAM: CHEST - 2 VIEW COMPARISON:  01/17/2016 chest radiograph. FINDINGS: Stable cardiomediastinal silhouette with top-normal heart size. No pneumothorax. No pleural effusion. Lungs appear clear, with no acute consolidative airspace disease and no pulmonary edema. IMPRESSION: No active cardiopulmonary disease. Electronically Signed   By: Delbert PhenixJason A Poff M.D.   On: 02/05/2018 21:08    Procedures Procedures   Medications Ordered in  ED Medications - No data to display   Initial Impression / Assessment and Plan / ED Course  I have reviewed the triage vital signs and the nursing notes.  Pertinent labs & imaging results that were available during my care of the patient were reviewed by me and considered in my medical decision making (see chart for details).  Chest discomfort which occurred during episode of tachycardia.  I suspect this was paroxysmal supraventricular tachycardia, but cannot rule out excess more atrial fibrillation or paroxysmal atrial flutter.  In the ED, ECG shows incomplete right bundle branch block but no ST or T changes, normal sinus rhythm.  ECG is done by EMS were also showing normal sinus rhythm.  Chest x-ray  is unremarkable and screening labs are unremarkable.  Patient is scheduled for a physical and 2 weeks, recommended that thyroid functions be checked at that time.  Also, she is being referred to cardiology for consideration for outpatient Holter monitor or event monitor.  Patient advised to return to the emergency department if she has recurrence of tachycardia.  Avoid alcohol, caffeine, other stimulants.  Old records are reviewed, and she has no relevant past visits.  Final Clinical Impressions(s) / ED Diagnoses   Final diagnoses:  Heart palpitations    ED Discharge Orders    None       Dione Booze, MD 02/06/18 [phone]

## 2018-02-06 NOTE — Discharge Instructions (Signed)
It is not clear what your heart rhythm was when it was going fast.  Please make a follow-up appointment with the cardiologist who may choose to put you on a heart monitor.  Also, please have your primary care provider check your thyroid blood tests.  If you have another episode, try to get to the emergency department while your heart is still beating fast so that we can check an EKG while your heart is beating fast.

## 2018-02-21 ENCOUNTER — Encounter (HOSPITAL_COMMUNITY): Payer: Self-pay | Admitting: Emergency Medicine

## 2018-02-21 ENCOUNTER — Other Ambulatory Visit: Payer: Self-pay

## 2018-02-21 ENCOUNTER — Ambulatory Visit (HOSPITAL_COMMUNITY)
Admission: EM | Admit: 2018-02-21 | Discharge: 2018-02-21 | Disposition: A | Discharge status: Home / Self Care | Payer: Medicare Other

## 2018-02-21 DIAGNOSIS — L03211 Cellulitis of face: Secondary | ICD-10-CM | POA: Diagnosis not present

## 2018-02-21 MED ORDER — IBUPROFEN 800 MG PO TABS: 800.0000 mg | ORAL_TABLET | Freq: Three times a day (TID) | ORAL | 0 refills | Status: DC

## 2018-02-21 MED ORDER — SULFAMETHOXAZOLE-TRIMETHOPRIM 800-160 MG PO TABS: 1.0000 | ORAL_TABLET | Freq: Two times a day (BID) | ORAL | 0 refills | Status: AC

## 2018-02-21 NOTE — ED Triage Notes (Signed)
Pt reports left eye burning/stinging that started one week ago.  She then developed an abscess to the center of her forehead and bilateral jaw muscle pain.

## 2018-02-21 NOTE — Discharge Instructions (Signed)
Take medications as prescribed. Use warm compress to face at least three-four times daily. Follow-up in here or with primary care provider in 48 hours if no change or worsening of symptoms.

## 2018-02-21 NOTE — ED Provider Notes (Signed)
[facility]    CSN: [id number] Arrival date & time: 02/21/18  1729     History   Chief Complaint Chief Complaint  Patient presents with  . Abscess  . Eye Pain    left  . Jaw Pain    bilateral    HPI Patricia Calderon is a 42 y.o. female.   Subjective:   Patricia Calderon is a 42 y.o. female who presents for evaluation of a possible skin infection located on the middle forehead. Onset of symptoms was 1 week ago with gradually worsening course since that time. Initially started as a pimple. Patient denies manipulating it at all. No drainage. Symptoms include pain and erythema located to the forehead and left eye lid. Patient denies chills, fever, nausea or vomiting. Treatment to date has included none.   The following portions of the patient's history were reviewed and updated as appropriate: allergies, current medications, past family history, past medical history, past social history, past surgical history and problem list.        Past Medical History:  Diagnosis Date  . Anxiety   . Depression   . Diabetes mellitus without complication (HCC)   . GERD (gastroesophageal reflux disease)   . Hypercholesteremia   . Hypertension   . Vitamin D deficiency     Patient Active Problem List   Diagnosis Date Noted  . Esophageal reflux 08/31/2014    Past Surgical History:  Procedure Laterality Date  . DENTAL SURGERY     8 years ago    OB History   None      Home Medications    Prior to Admission medications   Medication Sig Start Date End Date Taking? Authorizing Provider  clonazePAM (KLONOPIN) 0.5 MG tablet Take 0.5 mg by mouth 2 (two) times daily as needed for anxiety.   Yes [provider]  gabapentin (NEURONTIN) 300 MG capsule Take 300 mg by mouth 3 (three) times daily.   Yes [provider]  metFORMIN (GLUCOPHAGE-XR) 500 MG 24 hr tablet Take 1,000 mg by mouth 2 (two) times daily.    Yes [provider]  omeprazole  (PRILOSEC) 20 MG capsule Take 20 mg by mouth daily.   Yes [provider]  simvastatin (ZOCOR) 5 MG tablet Take 5 mg by mouth daily.   Yes [provider]  acetaminophen (TYLENOL) 325 MG tablet Take 650 mg by mouth every 6 (six) hours as needed for mild pain.    [provider]  fluticasone (FLONASE) 50 MCG/ACT nasal spray Place 1 spray into both nostrils daily. 07/24/17   Georgetta Haber, NP  ibuprofen (ADVIL,MOTRIN) 800 MG tablet Take 1 tablet (800 mg total) by mouth 3 (three) times daily. 02/21/18   Lurline Idol, FNP  pantoprazole (PROTONIX) 40 MG tablet Take 40 mg by mouth daily.    [provider]  sodium chloride (OCEAN) 0.65 % SOLN nasal spray Place 1 spray into both nostrils as needed for congestion. 07/24/17   Georgetta Haber, NP  sulfamethoxazole-trimethoprim (BACTRIM DS,SEPTRA DS) 800-160 MG tablet Take 1 tablet by mouth 2 (two) times daily for 7 days. 02/21/18 02/28/18  Lurline Idol, FNP    Family History Family History  Problem Relation Age of Onset  . Diabetes Mother   . Cancer Mother   . Diabetes Father     Social History Social History   Tobacco Use  . Smoking status: Former Smoker    Packs/day: 0.50    Types: Cigarettes  .  Smokeless tobacco: Never Used  Substance Use Topics  . Alcohol use: No  . Drug use: No     Allergies   Lexapro [escitalopram oxalate]; Lidocaine; Procaine; Citalopram; and Nickel   Review of Systems Review of Systems  Constitutional: Negative for fever.  Eyes: Negative for photophobia, pain, discharge, redness, itching and visual disturbance.  Gastrointestinal: Negative for nausea and vomiting.  Skin: Positive for wound.  Neurological: Negative for dizziness and headaches.  All other systems reviewed and are negative.    Physical Exam Triage Vital Signs ED Triage Vitals  Enc Vitals Group     BP 02/21/18 1807 (!) 151/89     Pulse Rate 02/21/18 1807 89     Resp --      Temp 02/21/18  1807 98.1 F (36.7 C)     Temp Source 02/21/18 1807 Oral     SpO2 02/21/18 1807 99 %     Weight --      Height --      Head Circumference --      Peak Flow --      Pain Score 02/21/18 1809 7     Pain Loc --      Pain Edu? --      Excl. in GC? --    No data found.  Updated Vital Signs BP (!) 151/89 (BP Location: Left Wrist)   Pulse 89   Temp 98.1 F (36.7 C) (Oral)   LMP 01/22/2018   SpO2 99%   Visual Acuity Right Eye Distance:   Left Eye Distance:   Bilateral Distance:    Right Eye Near:   Left Eye Near:    Bilateral Near:     Physical Exam  Constitutional: She is oriented to person, place, and time. She appears well-developed and well-nourished.  HENT:  Head: Normocephalic.    Right Ear: External ear normal.  Left Ear: External ear normal.  Nose: Nose normal.  Mouth/Throat: Oropharynx is clear and moist.  Eyes: Pupils are equal, round, and reactive to light. Conjunctivae and EOM are normal. Right eye exhibits no discharge. Left eye exhibits no discharge. No scleral icterus.    Neck: Normal range of motion. Neck supple.  Cardiovascular: Normal rate and regular rhythm.  Pulmonary/Chest: Effort normal and breath sounds normal.  Musculoskeletal: Normal range of motion.  Lymphadenopathy:    She has no cervical adenopathy.  Neurological: She is alert and oriented to person, place, and time.  Skin: Skin is warm and dry.  Psychiatric: She has a normal mood and affect.     UC Treatments / Results  Labs (all labs ordered are listed, but only abnormal results are displayed) Labs Reviewed - No data to display  EKG None  Radiology No results found.  Procedures Procedures (including critical care time)  Medications Ordered in UC Medications - No data to display  Initial Impression / Assessment and Plan / UC Course  I have reviewed the triage vital signs and the nursing notes.  Pertinent labs & imaging results that were available during my care of the  patient were reviewed by me and considered in my medical decision making (see chart for details).     42 year old female presenting with cellulitis to the upper forehead and left upper eyelid.  Vision intact.  PERRLA.  EOMI.  No abscess noted.  Patient afebrile.  Nontoxic-appearing.  Vital signs stable.   Plan:  Antibiotics given. Agricultural engineer distributed. Warm compresses  NSAIDs   Today's evaluation has revealed no signs  of a dangerous process. Discussed diagnosis with patient. Patient aware of their diagnosis, possible red flag symptoms to watch out for and need for close follow up. Patient understands verbal and written discharge instructions. Patient comfortable with plan and disposition.  Patient has a clear mental status at this time, good insight into illness (after discussion and teaching) and has clear judgment to make decisions regarding their care.  Documentation was completed with the aid of voice recognition software. Transcription may contain typographical errors. Final Clinical Impressions(s) / UC Diagnoses   Final diagnoses:  Cellulitis of forehead   Discharge Instructions   None    ED Prescriptions    Medication Sig Dispense Auth. Provider   sulfamethoxazole-trimethoprim (BACTRIM DS,SEPTRA DS) 800-160 MG tablet Take 1 tablet by mouth 2 (two) times daily for 7 days. 14 tablet Huntleigh Doolen, [place], FNP   ibuprofen (ADVIL,MOTRIN) 800 MG tablet Take 1 tablet (800 mg total) by mouth 3 (three) times daily. 21 tablet Lurline Idol, FNP     Controlled Substance Prescriptions [place] Controlled Substance Registry consulted? Not Applicable   Lurline Idol, FNP 02/21/18 1919

## 2018-02-23 ENCOUNTER — Encounter (HOSPITAL_COMMUNITY): Payer: Self-pay | Admitting: Emergency Medicine

## 2018-02-23 ENCOUNTER — Ambulatory Visit (HOSPITAL_COMMUNITY)
Admission: EM | Admit: 2018-02-23 | Discharge: 2018-02-23 | Disposition: A | Discharge status: Home / Self Care | Payer: Medicare Other | Attending: Family Medicine | Admitting: Family Medicine

## 2018-02-23 DIAGNOSIS — H1032 Unspecified acute conjunctivitis, left eye: Secondary | ICD-10-CM

## 2018-02-23 DIAGNOSIS — L03211 Cellulitis of face: Secondary | ICD-10-CM

## 2018-02-23 MED ORDER — CEFTRIAXONE SODIUM 1 G IJ SOLR
INTRAMUSCULAR | Status: AC
  Filled 2018-02-23: qty 10

## 2018-02-23 MED ORDER — TOBRAMYCIN 0.3 % OP OINT: 1.0000 "application " | TOPICAL_OINTMENT | Freq: Three times a day (TID) | OPHTHALMIC | 0 refills | Status: DC

## 2018-02-23 MED ORDER — LIDOCAINE HCL (PF) 1 % IJ SOLN
INTRAMUSCULAR | Status: AC
  Filled 2018-02-23: qty 4

## 2018-02-23 MED ORDER — DOXYCYCLINE HYCLATE 100 MG PO TABS: 100.0000 mg | ORAL_TABLET | Freq: Two times a day (BID) | ORAL | 0 refills | Status: DC

## 2018-02-23 MED ORDER — CEFTRIAXONE SODIUM 1 G IJ SOLR
1.0000 g | Freq: Once | INTRAMUSCULAR | Status: AC
  Administered 2018-02-23: 1 g via INTRAMUSCULAR

## 2018-02-23 NOTE — ED Provider Notes (Signed)
[facility]    CSN: [id number] Arrival date & time: 02/23/18  4098     History   Chief Complaint Chief Complaint  Patient presents with  . Eye Pain    HPI Patricia Calderon is a 42 y.o. female.   Pt here for left eye pain and wound to forehead; pt seen here three days ago and sts not feeling better.  In fact, the area in question on the forehead has gotten more tender and red or, the eyes started to swell and she feels a gritty surface to her left eye with increasing redness of the sclera.  She has not had fever or headache.   Urgent Care note from 10/9: ERIC MORGANTI is a 42 y.o. female who presents for evaluation of a possible skin infection located on the middle forehead. Onset of symptoms was 1 week ago with gradually worsening course since that time. Initially started as a pimple. Patient denies manipulating it at all. No drainage. Symptoms include pain and erythema located to the forehead and left eye lid. Patient denies chills, fever, nausea or vomiting. Treatment to date has included none.   The following portions of the patient's history were reviewed and updated as appropriate: allergies, current medications, past family history, past medical history, past social history, past surgical history and problem list.  Patient was treated with Septra.      Past Medical History:  Diagnosis Date  . Anxiety   . Depression   . Diabetes mellitus without complication (HCC)   . GERD (gastroesophageal reflux disease)   . Hypercholesteremia   . Hypertension   . Vitamin D deficiency     Patient Active Problem List   Diagnosis Date Noted  . Esophageal reflux 08/31/2014    Past Surgical History:  Procedure Laterality Date  . DENTAL SURGERY     8 years ago    OB History   None      Home Medications    Prior to Admission medications   Medication Sig Start Date End Date Taking? Authorizing Provider  acetaminophen (TYLENOL) 325 MG tablet Take 650 mg  by mouth every 6 (six) hours as needed for mild pain.    [provider]  clonazePAM (KLONOPIN) 0.5 MG tablet Take 0.5 mg by mouth 2 (two) times daily as needed for anxiety.    [provider]  doxycycline (VIBRA-TABS) 100 MG tablet Take 1 tablet (100 mg total) by mouth 2 (two) times daily. 02/23/18   Elvina Sidle, MD  fluticasone (FLONASE) 50 MCG/ACT nasal spray Place 1 spray into both nostrils daily. 07/24/17   Georgetta Haber, NP  gabapentin (NEURONTIN) 300 MG capsule Take 300 mg by mouth 3 (three) times daily.    [provider]  ibuprofen (ADVIL,MOTRIN) 800 MG tablet Take 1 tablet (800 mg total) by mouth 3 (three) times daily. 02/21/18   Lurline Idol, FNP  metFORMIN (GLUCOPHAGE-XR) 500 MG 24 hr tablet Take 1,000 mg by mouth 2 (two) times daily.     [provider]  omeprazole (PRILOSEC) 20 MG capsule Take 20 mg by mouth daily.    [provider]  pantoprazole (PROTONIX) 40 MG tablet Take 40 mg by mouth daily.    [provider]  simvastatin (ZOCOR) 5 MG tablet Take 5 mg by mouth daily.    [provider]  sodium chloride (OCEAN) 0.65 % SOLN nasal spray Place 1 spray into both nostrils as needed for congestion. 07/24/17   Georgetta Haber,  NP  sulfamethoxazole-trimethoprim (BACTRIM DS,SEPTRA DS) 800-160 MG tablet Take 1 tablet by mouth 2 (two) times daily for 7 days. 02/21/18 02/28/18  Lurline Idol, FNP  tobramycin (TOBREX) 0.3 % ophthalmic ointment Place 1 application into the left eye 3 (three) times daily. 02/23/18   Elvina Sidle, MD    Family History Family History  Problem Relation Age of Onset  . Diabetes Mother   . Cancer Mother   . Diabetes Father     Social History Social History   Tobacco Use  . Smoking status: Former Smoker    Packs/day: 0.50    Types: Cigarettes  . Smokeless tobacco: Never Used  Substance Use Topics  . Alcohol use: No  . Drug use: No     Allergies   Lexapro  [escitalopram oxalate]; Lidocaine; Procaine; Citalopram; and Nickel   Review of Systems Review of Systems  Constitutional: Negative.   Eyes: Positive for pain and redness.  Skin: Positive for rash.     Physical Exam Triage Vital Signs ED Triage Vitals  Enc Vitals Group     BP 02/23/18 1925 (!) 147/84     Pulse Rate 02/23/18 1925 79     Resp 02/23/18 1925 18     Temp 02/23/18 1925 97.9 F (36.6 C)     Temp Source 02/23/18 1925 Oral     SpO2 02/23/18 1925 100 %     Weight --      Height --      Head Circumference --      Peak Flow --      Pain Score 02/23/18 1926 7     Pain Loc --      Pain Edu? --      Excl. in GC? --    No data found.  Updated Vital Signs BP (!) 147/84 (BP Location: Left Arm)   Pulse 79   Temp 97.9 F (36.6 C) (Oral)   Resp 18   SpO2 100%    Physical Exam  Constitutional: She is oriented to person, place, and time. She appears well-developed and well-nourished.  HENT:  Right Ear: External ear normal.  Left Ear: External ear normal.  Mouth/Throat: Oropharynx is clear and moist.  Eyes:  Left conjunctiva is injected and the lids are mildly swollen (please see photograph)  Neck: Normal range of motion. Neck supple.  Pulmonary/Chest: Effort normal.  Musculoskeletal: Normal range of motion.  Lymphadenopathy:    She has no cervical adenopathy.  Neurological: She is alert and oriented to person, place, and time.  Skin: Skin is warm. Rash noted.  Nursing note and vitals reviewed.      UC Treatments / Results  Labs (all labs ordered are listed, but only abnormal results are displayed) Labs Reviewed - No data to display  EKG None  Radiology No results found.  Procedures Procedures (including critical care time)  Medications Ordered in UC Medications  cefTRIAXone (ROCEPHIN) injection 1 g (has no administration in time range)    Initial Impression / Assessment and Plan / UC Course  I have reviewed the triage vital signs and the  nursing notes.  Pertinent labs & imaging results that were available during my care of the patient were reviewed by me and considered in my medical decision making (see chart for details).    Final Clinical Impressions(s) / UC Diagnoses   Final diagnoses:  Facial cellulitis  Acute conjunctivitis of left eye, unspecified acute conjunctivitis type     Discharge Instructions  If you are doing much better Sunday, please call and let us know  If you are not doing better by Sunday, please return for follow-up evaluation.    ED Prescriptions    Medication Sig Dispense Auth. Provider   doxycycline (VIBRA-TABS) 100 MG tablet Take 1 tablet (100 mg total) by mouth 2 (two) times daily. 20 tablet Elvina Sidle, MD   tobramycin (TOBREX) 0.3 % ophthalmic ointment Place 1 application into the left eye 3 (three) times daily. 3.5 g Elvina Sidle, MD     Controlled Substance Prescriptions [place] Controlled Substance Registry consulted? Not Applicable   Elvina Sidle, MD 02/23/18 2000

## 2018-02-23 NOTE — Discharge Instructions (Signed)
If you are doing much better Sunday, please call and let us know  If you are not doing better by Sunday, please return for follow-up evaluation.

## 2018-02-23 NOTE — ED Triage Notes (Signed)
Pt here for left eye pain and wound to forehead; pt seen here three days ago and sts not feeling better

## 2018-03-13 HISTORY — PX: BARIATRIC SURGERY: SHX1103

## 2018-05-26 ENCOUNTER — Ambulatory Visit (HOSPITAL_COMMUNITY)
Admission: EM | Admit: 2018-05-26 | Discharge: 2018-05-26 | Disposition: A | Discharge status: Home / Self Care | Payer: Medicare Other | Attending: Family Medicine | Admitting: Family Medicine

## 2018-05-26 ENCOUNTER — Encounter (HOSPITAL_COMMUNITY): Payer: Self-pay | Admitting: Emergency Medicine

## 2018-05-26 ENCOUNTER — Other Ambulatory Visit: Payer: Self-pay

## 2018-05-26 ENCOUNTER — Ambulatory Visit (INDEPENDENT_AMBULATORY_CARE_PROVIDER_SITE_OTHER): Payer: Medicare Other

## 2018-05-26 DIAGNOSIS — M545 Low back pain, unspecified: Secondary | ICD-10-CM

## 2018-05-26 MED ORDER — CYCLOBENZAPRINE HCL 5 MG PO TABS: 5.0000 mg | ORAL_TABLET | Freq: Two times a day (BID) | ORAL | 0 refills | Status: DC | PRN

## 2018-05-26 MED ORDER — HYDROCODONE-ACETAMINOPHEN 5-325 MG PO TABS: 1.0000 | ORAL_TABLET | Freq: Four times a day (QID) | ORAL | 0 refills | Status: DC | PRN
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
## 2018-05-26 NOTE — Discharge Instructions (Signed)
No fracture on x-ray Please take Tylenol for mild to moderate pain May use hydrocodone for severe pain, limit use, this will cause drowsiness, do not drive or work after taking  You may use flexeril as needed to help with pain. This is a muscle relaxer and causes sedation- please use only at bedtime or when you will be home and not have to drive/work  Ice and heat
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
## 2019-05-23 ENCOUNTER — Ambulatory Visit
Admission: EM | Admit: 2019-05-23 | Discharge: 2019-05-23 | Disposition: A | Discharge status: Home / Self Care | Payer: Medicare Other

## 2019-05-23 ENCOUNTER — Encounter: Payer: Self-pay | Admitting: Emergency Medicine

## 2019-05-23 ENCOUNTER — Other Ambulatory Visit: Payer: Self-pay

## 2019-05-23 DIAGNOSIS — Z7189 Other specified counseling: Secondary | ICD-10-CM | POA: Diagnosis not present

## 2019-05-23 DIAGNOSIS — Z711 Person with feared health complaint in whom no diagnosis is made: Secondary | ICD-10-CM | POA: Diagnosis not present

## 2019-05-23 NOTE — ED Provider Notes (Signed)
[name]    CSN: [id number] Arrival date & time: 05/23/19  1405      History   Chief Complaint Chief Complaint  Patient presents with  . COVID Test    HPI Patricia Calderon is a 44 y.o. female with history hypertension, diabetes presenting for Covid test.  States her brother developed symptoms last night that are concerning for Covid: Sore throat, dry cough.  No known fever, shortness of breath, chest pain.  No confirmed Covid exposure.  Patient currently asymptomatic, afebrile.   Past Medical History:  Diagnosis Date  . Anxiety   . Depression   . Diabetes mellitus without complication (HCC)   . GERD (gastroesophageal reflux disease)   . Hypercholesteremia   . Hypertension   . Vitamin D deficiency     Patient Active Problem List   Diagnosis Date Noted  . Esophageal reflux 08/31/2014    Past Surgical History:  Procedure Laterality Date  . DENTAL SURGERY     8 years ago    OB History   No obstetric history on file.      Home Medications    Prior to Admission medications   Medication Sig Start Date End Date Taking? Authorizing Provider  amphetamine-dextroamphetamine (ADDERALL) 20 MG tablet Take 20 mg by mouth 2 (two) times daily.   Yes [provider]  propranolol (INDERAL) 20 MG tablet Take 20 mg by mouth 2 (two) times daily.   Yes [provider]  acetaminophen (TYLENOL) 325 MG tablet Take 650 mg by mouth every 6 (six) hours as needed for mild pain.    [provider]  clonazePAM (KLONOPIN) 0.5 MG tablet Take 0.5 mg by mouth 2 (two) times daily as needed for anxiety.    [provider]  cyclobenzaprine (FLEXERIL) 5 MG tablet Take 1-2 tablets (5-10 mg total) by mouth 2 (two) times daily as needed for muscle spasms. 05/26/18   Wieters, Hallie C, PA-C  doxycycline (VIBRA-TABS) 100 MG tablet Take 1 tablet (100 mg total) by mouth 2 (two) times daily. 02/23/18   Elvina Sidle, MD  fluticasone (FLONASE) 50 MCG/ACT  nasal spray Place 1 spray into both nostrils daily. 07/24/17   Georgetta Haber, NP  gabapentin (NEURONTIN) 300 MG capsule Take 300 mg by mouth 3 (three) times daily.    [provider]  HYDROcodone-acetaminophen (NORCO/VICODIN) 5-325 MG tablet Take 1 tablet by mouth every 6 (six) hours as needed for severe pain. 05/26/18   Wieters, Hallie C, PA-C  ibuprofen (ADVIL,MOTRIN) 800 MG tablet Take 1 tablet (800 mg total) by mouth 3 (three) times daily. 02/21/18   Lurline Idol, FNP  metFORMIN (GLUCOPHAGE-XR) 500 MG 24 hr tablet Take 1,000 mg by mouth 2 (two) times daily.     [provider]  omeprazole (PRILOSEC) 20 MG capsule Take 20 mg by mouth daily.    [provider]  pantoprazole (PROTONIX) 40 MG tablet Take 40 mg by mouth daily.    [provider]  simvastatin (ZOCOR) 5 MG tablet Take 5 mg by mouth daily.    [provider]  sodium chloride (OCEAN) 0.65 % SOLN nasal spray Place 1 spray into both nostrils as needed for congestion. 07/24/17   Georgetta Haber, NP  tobramycin (TOBREX) 0.3 % ophthalmic ointment Place 1 application into the left eye 3 (three) times daily. 02/23/18   Elvina Sidle, MD    Family History Family History  Problem Relation Age of Onset  . Diabetes Mother   .  Cancer Mother   . Diabetes Father     Social History Social History   Tobacco Use  . Smoking status: Former Smoker    Packs/day: 0.50    Types: Cigarettes  . Smokeless tobacco: Never Used  Substance Use Topics  . Alcohol use: No  . Drug use: No     Allergies   Nsaids, Lexapro [escitalopram oxalate], Lidocaine, Procaine, Citalopram, and Nickel   Review of Systems Review of Systems  Constitutional: Negative for fatigue and fever.  HENT: Negative for ear pain, sinus pain, sore throat and voice change.   Eyes: Negative for pain, redness and visual disturbance.  Respiratory: Negative for cough and shortness of breath.   Cardiovascular: Negative for  chest pain and palpitations.  Gastrointestinal: Negative for abdominal pain, diarrhea and vomiting.  Musculoskeletal: Negative for arthralgias and myalgias.  Skin: Negative for rash and wound.  Neurological: Negative for syncope and headaches.     Physical Exam Triage Vital Signs ED Triage Vitals  Enc Vitals Group     BP      Pulse      Resp      Temp      Temp src      SpO2      Weight      Height      Head Circumference      Peak Flow      Pain Score      Pain Loc      Pain Edu?      Excl. in GC?    No data found.  Updated Vital Signs BP 117/78 (BP Location: Right Arm)   Pulse 79   Temp 97.6 F (36.4 C) (Temporal)   Resp 16   LMP 05/08/2019   SpO2 98%   Visual Acuity Right Eye Distance:   Left Eye Distance:   Bilateral Distance:    Right Eye Near:   Left Eye Near:    Bilateral Near:     Physical Exam Constitutional:      General: She is not in acute distress. HENT:     Head: Normocephalic and atraumatic.  Eyes:     General: No scleral icterus.    Pupils: Pupils are equal, round, and reactive to light.  Cardiovascular:     Rate and Rhythm: Normal rate.  Pulmonary:     Effort: Pulmonary effort is normal. No respiratory distress.     Breath sounds: No wheezing.  Skin:    Capillary Refill: Capillary refill takes less than 2 seconds.     Coloration: Skin is not jaundiced or pale.  Neurological:     Mental Status: She is alert and oriented to person, place, and time.      UC Treatments / Results  Labs (all labs ordered are listed, but only abnormal results are displayed) Labs Reviewed - No data to display  EKG   Radiology No results found.  Procedures Procedures (including critical care time)  Medications Ordered in UC Medications - No data to display  Initial Impression / Assessment and Plan / UC Course  I have reviewed the triage vital signs and the nursing notes.  Pertinent labs & imaging results that were available during my  care of the patient were reviewed by me and considered in my medical decision making (see chart for details).      Patient afebrile, nontoxic, with SpO2 98%.  Patient is without known Covid exposure, fever, symptoms: We will defer Covid testing at this time.  Return precautions discussed, patient verbalized understanding and is agreeable to plan. Final Clinical Impressions(s) / UC Diagnoses   Final diagnoses:  Worried well  Counseled about COVID-19 virus infection     Discharge Instructions     Would recommend covid testing if you develop symptoms (fever, cough difficulty breathing) OR have close contact with KNOWN covid positive persons.    ED Prescriptions    None     PDMP not reviewed this encounter.   Hall[place]Potvin, [place], [place] 05/23/19 1725

## 2019-05-23 NOTE — Discharge Instructions (Signed)
Would recommend covid testing if you develop symptoms (fever, cough difficulty breathing) OR have close contact with KNOWN covid positive persons.

## 2019-05-23 NOTE — ED Triage Notes (Signed)
Pt presents to [facility] for assessment after brother developed symptoms of COVID last night.  [facility] has not tested positive at this point.  Patient denies symptoms at this time.

## 2019-05-27 ENCOUNTER — Other Ambulatory Visit: Payer: Self-pay

## 2019-05-27 ENCOUNTER — Encounter: Payer: Self-pay | Admitting: Emergency Medicine

## 2019-05-27 ENCOUNTER — Ambulatory Visit
Admission: EM | Admit: 2019-05-27 | Discharge: 2019-05-27 | Disposition: A | Discharge status: Home / Self Care | Payer: Medicare Other | Attending: Emergency Medicine | Admitting: Emergency Medicine

## 2019-05-27 DIAGNOSIS — Z20822 Contact with and (suspected) exposure to covid-19: Secondary | ICD-10-CM | POA: Diagnosis not present

## 2019-05-27 DIAGNOSIS — R0982 Postnasal drip: Secondary | ICD-10-CM

## 2019-05-27 NOTE — ED Triage Notes (Signed)
Pt presents to [facility] after brother tested positive for COVID on Friday, whom she lives with currently.  Patient c/o very mild sore throat and mild nausea, states happens with her chronic conditions, so unsure if they're related.

## 2019-05-27 NOTE — ED Provider Notes (Signed)
[name]    CSN: [id number] Arrival date & time: 05/27/19  1346      History   Chief Complaint Chief Complaint  Patient presents with  . Exposure to COVID    HPI Patricia Calderon is a 44 y.o. female with history of obesity, hypertension, diabetes  Presenting for Covid testing: Exposure: Brother who tested positive Wednesday Date of exposure: Cohabitate/chronic Any fever, symptoms since exposure: Mild sore throat, nausea though patient states this is chronic/stable for her and she is status post bariatric surgery and has history of GERD and postnasal drip.   Past Medical History:  Diagnosis Date  . Anxiety   . Depression   . Diabetes mellitus without complication ([place])   . GERD (gastroesophageal reflux disease)   . Hypercholesteremia   . Hypertension   . Vitamin D deficiency     Patient Active Problem List   Diagnosis Date Noted  . Esophageal reflux 08/31/2014    Past Surgical History:  Procedure Laterality Date  . DENTAL SURGERY     8 years ago    OB History   No obstetric history on file.      Home Medications    Prior to Admission medications   Medication Sig Start Date End Date Taking? Authorizing Provider  acetaminophen (TYLENOL) 325 MG tablet Take 650 mg by mouth every 6 (six) hours as needed for mild pain.    [provider]  amphetamine-dextroamphetamine (ADDERALL) 20 MG tablet Take 20 mg by mouth 2 (two) times daily.    [provider]  clonazePAM (KLONOPIN) 0.5 MG tablet Take 0.5 mg by mouth 2 (two) times daily as needed for anxiety.    [provider]  cyclobenzaprine (FLEXERIL) 5 MG tablet Take 1-2 tablets (5-10 mg total) by mouth 2 (two) times daily as needed for muscle spasms. 05/26/18   Wieters, Hallie C, PA-C  doxycycline (VIBRA-TABS) 100 MG tablet Take 1 tablet (100 mg total) by mouth 2 (two) times daily. 02/23/18   Robyn Haber, MD  fluticasone (FLONASE) 50 MCG/ACT nasal spray Place 1 spray  into both nostrils daily. 07/24/17   Zigmund Gottron, NP  gabapentin (NEURONTIN) 300 MG capsule Take 300 mg by mouth 3 (three) times daily.    [provider]  HYDROcodone-acetaminophen (NORCO/VICODIN) 5-325 MG tablet Take 1 tablet by mouth every 6 (six) hours as needed for severe pain. 05/26/18   Wieters, Hallie C, PA-C  ibuprofen (ADVIL,MOTRIN) 800 MG tablet Take 1 tablet (800 mg total) by mouth 3 (three) times daily. 02/21/18   Enrique Sack, FNP  metFORMIN (GLUCOPHAGE-XR) 500 MG 24 hr tablet Take 1,000 mg by mouth 2 (two) times daily.     [provider]  omeprazole (PRILOSEC) 20 MG capsule Take 20 mg by mouth daily.    [provider]  pantoprazole (PROTONIX) 40 MG tablet Take 40 mg by mouth daily.    [provider]  propranolol (INDERAL) 20 MG tablet Take 20 mg by mouth 2 (two) times daily.    [provider]  simvastatin (ZOCOR) 5 MG tablet Take 5 mg by mouth daily.    [provider]  sodium chloride (OCEAN) 0.65 % SOLN nasal spray Place 1 spray into both nostrils as needed for congestion. 07/24/17   Zigmund Gottron, NP  tobramycin (TOBREX) 0.3 % ophthalmic ointment Place 1 application into the left eye 3 (three) times daily. 02/23/18   Robyn Haber, MD    Family History Family History  Problem Relation  Age of Onset  . Diabetes Mother   . Cancer Mother   . Diabetes Father     Social History Social History   Tobacco Use  . Smoking status: Former Smoker    Packs/day: 0.50    Types: Cigarettes  . Smokeless tobacco: Never Used  Substance Use Topics  . Alcohol use: No  . Drug use: No     Allergies   Nsaids, Lexapro [escitalopram oxalate], Lidocaine, Procaine, Citalopram, and Nickel   Review of Systems Review of Systems  Constitutional: Negative for fatigue and fever.  HENT: Positive for postnasal drip and sore throat. Negative for ear pain, sinus pain, trouble swallowing and voice change.   Eyes: Negative for  pain, redness and visual disturbance.  Respiratory: Negative for cough and shortness of breath.   Cardiovascular: Negative for chest pain and palpitations.  Gastrointestinal: Positive for nausea. Negative for abdominal pain, diarrhea and vomiting.  Musculoskeletal: Negative for arthralgias and myalgias.  Skin: Negative for rash and wound.  Neurological: Negative for syncope and headaches.     Physical Exam Triage Vital Signs ED Triage Vitals [05/27/19 1353]  Enc Vitals Group     BP      Pulse      Resp      Temp      Temp src      SpO2      Weight      Height      Head Circumference      Peak Flow      Pain Score 0     Pain Loc      Pain Edu?      Excl. in GC?    No data found.  Updated Vital Signs LMP 05/08/2019   Visual Acuity Right Eye Distance:   Left Eye Distance:   Bilateral Distance:    Right Eye Near:   Left Eye Near:    Bilateral Near:     Physical Exam Constitutional:      General: She is not in acute distress.    Appearance: She is obese. She is not ill-appearing.  HENT:     Head: Normocephalic and atraumatic.     Mouth/Throat:     Comments: Patient declined Eyes:     General: No scleral icterus.    Pupils: Pupils are equal, round, and reactive to light.  Cardiovascular:     Rate and Rhythm: Normal rate.  Pulmonary:     Effort: Pulmonary effort is normal. No respiratory distress.     Breath sounds: No wheezing.  Skin:    Coloration: Skin is not jaundiced or pale.  Neurological:     Mental Status: She is alert and oriented to person, place, and time.      UC Treatments / Results  Labs (all labs ordered are listed, but only abnormal results are displayed) Labs Reviewed  NOVEL CORONAVIRUS, NAA    EKG   Radiology No results found.  Procedures Procedures (including critical care time)  Medications Ordered in UC Medications - No data to display  Initial Impression / Assessment and Plan / UC Course  I have reviewed the triage  vital signs and the nursing notes.  Pertinent labs & imaging results that were available during my care of the patient were reviewed by me and considered in my medical decision making (see chart for details).     Patient afebrile, nontoxic, with SpO2 97%.  Covid PCR pending.  Patient to quarantine until results are back.  We  will continue supportive management.  Return precautions discussed, patient verbalized understanding and is agreeable to plan. Final Clinical Impressions(s) / UC Diagnoses   Final diagnoses:  Exposure to COVID-19 virus  Post-nasal drip     Discharge Instructions     Your COVID test is pending - it is important to quarantine / isolate at home until your results are back. If you test positive and would like further evaluation for persistent or worsening symptoms, you may schedule an E-visit or virtual (video) visit throughout the [facility] app or website.  PLEASE NOTE: If you develop severe chest pain or shortness of breath please go to the ER or call 9-1-1 for further evaluation --> DO NOT schedule electronic or virtual visits for this. Please call our office for further guidance / recommendations as needed.    ED Prescriptions    None     PDMP not reviewed this encounter.   Hall-Potvin, [place], [place] 05/27/19 1443

## 2019-05-27 NOTE — Discharge Instructions (Addendum)
Your COVID test is pending - it is important to quarantine / isolate at home until your results are back. °If you test positive and would like further evaluation for persistent or worsening symptoms, you may schedule an E-visit or virtual (video) visit throughout the Home MyChart app or website. ° °PLEASE NOTE: If you develop severe chest pain or shortness of breath please go to the ER or call 9-1-1 for further evaluation --> DO NOT schedule electronic or virtual visits for this. °Please call our office for further guidance / recommendations as needed. °

## 2019-05-28 LAB — NOVEL CORONAVIRUS, NAA: SARS-CoV-2, NAA: NOT DETECTED

## 2019-05-31 ENCOUNTER — Ambulatory Visit (HOSPITAL_COMMUNITY)
Admission: RE | Admit: 2019-05-31 | Discharge: 2019-05-31 | Disposition: A | Discharge status: Home / Self Care | Payer: Medicare Other | Source: Ambulatory Visit | Attending: Family Medicine | Admitting: Family Medicine

## 2019-05-31 ENCOUNTER — Ambulatory Visit (HOSPITAL_COMMUNITY)
Admission: EM | Admit: 2019-05-31 | Discharge: 2019-05-31 | Disposition: A | Discharge status: Home / Self Care | Payer: Medicare Other | Attending: Family Medicine | Admitting: Family Medicine

## 2019-05-31 ENCOUNTER — Encounter (HOSPITAL_COMMUNITY): Payer: Self-pay | Admitting: Emergency Medicine

## 2019-05-31 ENCOUNTER — Other Ambulatory Visit: Payer: Self-pay

## 2019-05-31 DIAGNOSIS — M7989 Other specified soft tissue disorders: Secondary | ICD-10-CM | POA: Diagnosis not present

## 2019-05-31 DIAGNOSIS — R609 Edema, unspecified: Secondary | ICD-10-CM

## 2019-05-31 NOTE — ED Provider Notes (Signed)
[facility]    CSN: [id number] Arrival date & time: 05/31/19  1317      History   Chief Complaint Chief Complaint  Patient presents with  . Appointment  . Hand Pain    HPI Patricia Calderon is a 44 y.o. female.   Pt is a 44 year old female presents today with right hand swelling, mild tenderness with discoloration to fingertips.  Symptoms have been constant.  Denies any history of the same.  Denies any itching, redness.  Denies any history of raynaud.  No injuries to the hand, fever.  No numbness, tingling.  Reporting felt like there was fluid leaking out of the fingers earlier this morning.  Says the swelling has somewhat improved. She had a nicotine patch on the anterior forearm yesterday.   ROS per HPI    Hand Pain    Past Medical History:  Diagnosis Date  . Anxiety   . Depression   . Diabetes mellitus without complication ([facility])   . GERD (gastroesophageal reflux disease)   . Hypercholesteremia   . Hypertension   . Vitamin D deficiency     Patient Active Problem List   Diagnosis Date Noted  . Esophageal reflux 08/31/2014    Past Surgical History:  Procedure Laterality Date  . DENTAL SURGERY     8 years ago    OB History   No obstetric history on file.      Home Medications    Prior to Admission medications   Medication Sig Start Date End Date Taking? Authorizing Provider  acetaminophen (TYLENOL) 325 MG tablet Take 650 mg by mouth every 6 (six) hours as needed for mild pain.    [provider]  amphetamine-dextroamphetamine (ADDERALL) 20 MG tablet Take 20 mg by mouth 2 (two) times daily.    [provider]  clonazePAM (KLONOPIN) 0.5 MG tablet Take 0.5 mg by mouth 2 (two) times daily as needed for anxiety.    [provider]  cyclobenzaprine (FLEXERIL) 5 MG tablet Take 1-2 tablets (5-10 mg total) by mouth 2 (two) times daily as needed for muscle spasms. 05/26/18   Wieters, Hallie C, PA-C  doxycycline (VIBRA-TABS)  100 MG tablet Take 1 tablet (100 mg total) by mouth 2 (two) times daily. 02/23/18   Elvina Sidle, MD  fluticasone (FLONASE) 50 MCG/ACT nasal spray Place 1 spray into both nostrils daily. 07/24/17   Georgetta Haber, NP  gabapentin (NEURONTIN) 300 MG capsule Take 300 mg by mouth 3 (three) times daily.    [provider]  HYDROcodone-acetaminophen (NORCO/VICODIN) 5-325 MG tablet Take 1 tablet by mouth every 6 (six) hours as needed for severe pain. 05/26/18   Wieters, Hallie C, PA-C  ibuprofen (ADVIL,MOTRIN) 800 MG tablet Take 1 tablet (800 mg total) by mouth 3 (three) times daily. 02/21/18   Lurline Idol, FNP  metFORMIN (GLUCOPHAGE-XR) 500 MG 24 hr tablet Take 1,000 mg by mouth 2 (two) times daily.     [provider]  omeprazole (PRILOSEC) 20 MG capsule Take 20 mg by mouth daily.    [provider]  pantoprazole (PROTONIX) 40 MG tablet Take 40 mg by mouth daily.    [provider]  propranolol (INDERAL) 20 MG tablet Take 20 mg by mouth 2 (two) times daily.    [provider]  simvastatin (ZOCOR) 5 MG tablet Take 5 mg by mouth daily.    [provider]  sodium chloride (OCEAN) 0.65 % SOLN nasal spray Place 1 spray into both nostrils  as needed for congestion. 07/24/17   Zigmund Gottron, NP  tobramycin (TOBREX) 0.3 % ophthalmic ointment Place 1 application into the left eye 3 (three) times daily. 02/23/18   Robyn Haber, MD    Family History Family History  Problem Relation Age of Onset  . Diabetes Mother   . Cancer Mother   . Diabetes Father     Social History Social History   Tobacco Use  . Smoking status: Former Smoker    Packs/day: 0.50    Types: Cigarettes  . Smokeless tobacco: Never Used  Substance Use Topics  . Alcohol use: No  . Drug use: No     Allergies   Nsaids, Lexapro [escitalopram oxalate], Lidocaine, Procaine, Citalopram, and Nickel   Review of Systems Review of Systems   Physical Exam Triage Vital  Signs ED Triage Vitals  Enc Vitals Group     BP 05/31/19 1425 118/76     Pulse Rate 05/31/19 1425 80     Resp 05/31/19 1425 16     Temp 05/31/19 1425 98.4 F (36.9 C)     Temp Source 05/31/19 1425 Oral     SpO2 05/31/19 1425 100 %     Weight --      Height --      Head Circumference --      Peak Flow --      Pain Score 05/31/19 1422 5     Pain Loc --      Pain Edu? --      Excl. in [place]? --    No data found.  Updated Vital Signs BP 118/76   Pulse 80   Temp 98.4 F (36.9 C) (Oral)   Resp 16   LMP 05/29/2019   SpO2 100%   Visual Acuity Right Eye Distance:   Left Eye Distance:   Bilateral Distance:    Right Eye Near:   Left Eye Near:    Bilateral Near:     Physical Exam Vitals and nursing note reviewed.  Constitutional:      General: She is not in acute distress.    Appearance: Normal appearance. She is not ill-appearing, toxic-appearing or diaphoretic.  HENT:     Head: Normocephalic.     Nose: Nose normal.     Mouth/Throat:     Pharynx: Oropharynx is clear.  Eyes:     Conjunctiva/sclera: Conjunctivae normal.  Pulmonary:     Effort: Pulmonary effort is normal.  Musculoskeletal:        General: Normal range of motion.     Cervical back: Normal range of motion.  Skin:    Coloration: Skin is cyanotic.     Comments: Blue discoloration to finger tips on the right hand. Mild swelling to finger tips and hand. No redness and mildly tender. Cool to touch. No rash Mark where nicotine patch was.  Left hand normal but also cool to touch.   Neurological:     Mental Status: She is alert.  Psychiatric:        Mood and Affect: Mood normal.      UC Treatments / Results  Labs (all labs ordered are listed, but only abnormal results are displayed) Labs Reviewed - No data to display  EKG   Radiology No results found.  Procedures Procedures (including critical care time)  Medications Ordered in UC Medications - No data to display  Initial Impression /  Assessment and Plan / UC Course  I have reviewed the triage vital signs and the  nursing notes.  Pertinent labs & imaging results that were available during my care of the patient were reviewed by me and considered in my medical decision making (see chart for details).     Swelling and discoloration of the right hand-likely symptoms are related to vasoconstriction from the nicotine patch at the anterior forearm  we will send her for blood clot rule out for reassurance Patient going at 430 for appointment for ultrasound Final Clinical Impressions(s) / UC Diagnoses   Final diagnoses:  Swelling of right hand     Discharge Instructions     Go to the hospital for [place] at 4:30  Entrance A off [address]  If the ultrasound is normal we will monitor and avoid the nicotine patch on lower arm.     ED Prescriptions    None     PDMP not reviewed this encounter.   Janace Aris, NP 05/31/19 1536

## 2019-05-31 NOTE — Discharge Instructions (Addendum)
Go to the hospital for [place] at 4:30  Entrance A off [address]  If the ultrasound is normal we will monitor and avoid the nicotine patch on lower arm.

## 2019-05-31 NOTE — ED Triage Notes (Signed)
PT reports she had minor cut on right hand Sunday. She woke up this morning with pain and swelling in right hand.

## 2019-05-31 NOTE — ED Provider Notes (Signed)
Received call from Vascular that study was negative for DVT. Technician did note some arterial vasoconstriction although patent. Phone call to patient to discuss results, she states her symptoms have improved, with improvement in swelling, and decrease in dusky color to tip of fingers. No further drainage/ weeping from fingers. Discussed that recommended to continue to monitor, if no further improvement may need to return to determine if any further concern for more systemic issue such as vasculitis- basic labs and inflammatory markers would be appropriate follow up. To go to ER for any worsening of dusky color, pain, swelling, numbness or tingling. Patient verbalized understanding and agreeable to plan.      Georgetta Haber, NP 05/31/2019 5:17 PM    Georgetta Haber, NP 05/31/19 1717

## 2019-05-31 NOTE — Progress Notes (Signed)
Right upper extremity venous duplex has been completed. Preliminary results can be found in CV Proc through chart review.  Results were given to Linus Mako NP.  05/31/19 4:50 PM Olen Cordial RVT

## 2019-05-31 NOTE — ED Triage Notes (Signed)
History of cellulitis.

## 2019-09-23 ENCOUNTER — Encounter: Payer: Self-pay | Admitting: *Deleted

## 2019-09-24 ENCOUNTER — Encounter: Payer: Self-pay | Admitting: Diagnostic Neuroimaging

## 2019-09-24 ENCOUNTER — Ambulatory Visit (INDEPENDENT_AMBULATORY_CARE_PROVIDER_SITE_OTHER): Payer: Medicare Other | Admitting: Diagnostic Neuroimaging

## 2019-09-24 ENCOUNTER — Other Ambulatory Visit: Payer: Self-pay

## 2019-09-24 ENCOUNTER — Telehealth: Payer: Self-pay | Admitting: Diagnostic Neuroimaging

## 2019-09-24 VITALS — BP 120/76 | HR 91 | Temp 96.4°F | Ht 65.0 in | Wt 209.0 lb

## 2019-09-24 DIAGNOSIS — R6889 Other general symptoms and signs: Secondary | ICD-10-CM

## 2019-09-24 NOTE — Telephone Encounter (Signed)
Medicare order sent to GI. No auth they will reach out to the patient to schedule.  

## 2019-09-24 NOTE — Progress Notes (Signed)
[facility]  PATIENT: Patricia Calderon DOB: 11/05/1975  REFERRING CLINICIAN: [place], [place]* HISTORY FROM: patient  REASON FOR VISIT: new consult    HISTORICAL  CHIEF COMPLAINT:  Chief Complaint  Patient presents with  . Anhidrosis, hot flashes    rm 6 New Pt "inability to regulate body temp (hypohydrosis; cannot walk a straight line since my ECT in 2018"    HISTORY OF PRESENT ILLNESS:   44 year old female with history of Raynaud's syndrome, ADHD, spinal stenosis, type 2 diabetes, gastric bypass surgery, depression, anxiety, here for evaluation of temperature intolerance and anhidrosis.  Patient had gastric bypass surgery in October 2019 with a vertical sleeve.  Since that time she has lost 160 pounds.  Following this in summer 2020 she noticed inability to sweat.  She also became easily overheated with exertion or elevated environmental temperatures.  During the wintertime she also noticed that she was not able to "shiver" when she felt cold and then suddenly would feel extremely cold.  Patient is concerned about hypothalamic dysfunction or autonomic dysfunction.  Patient has been to PCP, OB/GYN and rheumatology without specific diagnosis.   REVIEW OF SYSTEMS: Full 14 system review of systems performed and negative with exception of: As per HPI.  ALLERGIES: Allergies  Allergen Reactions  . Nsaids Nausea Only    Patient has gastric bypass and was advised not to take NSAIDS  . Hydrocodone-Acetaminophen Itching  . Lexapro [Escitalopram Oxalate]   . Lidocaine Other (See Comments)    shakes  . Procaine Other (See Comments)    shakiness  . Citalopram Palpitations  . Nickel Rash    HOME MEDICATIONS: Outpatient Medications Prior to Visit  Medication Sig Dispense Refill  . acetaminophen (TYLENOL) 325 MG tablet Take 650 mg by mouth every 6 (six) hours as needed for mild pain.    Marland Kitchen amphetamine-dextroamphetamine (ADDERALL) 20 MG tablet Take 20 mg  by mouth 2 (two) times daily.    . Biotin 1 MG CAPS Take by mouth daily.    . Cholecalciferol (VITAMIN D3) 10 MCG (400 UNIT) CAPS Take by mouth.    . clonazePAM (KLONOPIN) 0.5 MG tablet Take 0.5 mg by mouth 2 (two) times daily as needed for anxiety.    . fluticasone (FLONASE) 50 MCG/ACT nasal spray Place 1 spray into both nostrils daily. 16 g 2  . hydrochlorothiazide (HYDRODIURIL) 25 MG tablet TAKE 1 TABLET BY MOUTH DAILY    . lamoTRIgine (LAMICTAL) 200 MG tablet 200 mg daily.    . Melatonin 1 MG CAPS Take by mouth as needed.    . methocarbamol (ROBAXIN) 500 MG tablet as needed.    . Multiple Vitamins-Minerals (MULTIVITAMIN WITH MINERALS) tablet Take 1 tablet by mouth daily. bariatric    . ondansetron (ZOFRAN-ODT) 4 MG disintegrating tablet DISSOLVE 1 TABLET(4 MG) ON THE TONGUE EVERY 8 HOURS FOR UP TO 7 DAYS AS NEEDED FOR NAUSEA    . pantoprazole (PROTONIX) 40 MG tablet Take 40 mg by mouth daily.    . rosuvastatin (CRESTOR) 10 MG tablet TAKE 1 TABLET(10 MG) BY MOUTH DAILY    . sildenafil (REVATIO) 20 MG tablet Take by mouth 2 (two) times daily.    . sodium chloride (OCEAN) 0.65 % SOLN nasal spray Place 1 spray into both nostrils as needed for congestion. 60 mL 0  . UNABLE TO FIND Med Name: testosterone cream 4.0 mg 2 x weekly    . cyclobenzaprine (FLEXERIL) 5 MG tablet Take 1-2 tablets (5-10 mg total) by mouth 2 (  two) times daily as needed for muscle spasms. (Patient not taking: Reported on 09/24/2019) 24 tablet 0  . gabapentin (NEURONTIN) 300 MG capsule Take 300 mg by mouth 3 (three) times daily.    . metFORMIN (GLUCOPHAGE-XR) 500 MG 24 hr tablet Take 1,000 mg by mouth 2 (two) times daily.     Marland Kitchen tobramycin (TOBREX) 0.3 % ophthalmic ointment Place 1 application into the left eye 3 (three) times daily. (Patient not taking: Reported on 09/24/2019) 3.5 g 0  . doxycycline (VIBRA-TABS) 100 MG tablet Take 1 tablet (100 mg total) by mouth 2 (two) times daily. 20 tablet 0  . HYDROcodone-acetaminophen  (NORCO/VICODIN) 5-325 MG tablet Take 1 tablet by mouth every 6 (six) hours as needed for severe pain. 12 tablet 0  . ibuprofen (ADVIL,MOTRIN) 800 MG tablet Take 1 tablet (800 mg total) by mouth 3 (three) times daily. 21 tablet 0  . omeprazole (PRILOSEC) 20 MG capsule Take 20 mg by mouth daily.    . propranolol (INDERAL) 20 MG tablet Take 20 mg by mouth 2 (two) times daily.    . simvastatin (ZOCOR) 5 MG tablet Take 5 mg by mouth daily.     No facility-administered medications prior to visit.    PAST MEDICAL HISTORY: Past Medical History:  Diagnosis Date  . ADHD   . Anhidrosis   . Anxiety   . Bipolar 1 disorder ([place])   . Depression   . Diabetes mellitus without complication ([place])    controlled with diet  . Eczema   . GERD (gastroesophageal reflux disease)   . History of electroconvulsive therapy 05/2016   8 sessions  . History of suicidal ideation   . Hot flashes   . Hypercholesteremia   . Hypertension   . MDD (major depressive disorder)   . Raynaud's syndrome   . Spinal stenosis   . Vitamin D deficiency     PAST SURGICAL HISTORY: Past Surgical History:  Procedure Laterality Date  . BARIATRIC SURGERY  03/13/2018  . DENTAL SURGERY     8 years ago  . WISDOM TOOTH EXTRACTION      FAMILY HISTORY: Family History  Problem Relation Age of Onset  . Diabetes Mother   . Cancer Mother        breast  . Alzheimer's disease Mother   . Depression Mother   . Diabetes Father   . Heart disease Father   . Heart attack Father   . Depression Father     SOCIAL HISTORY: Social History   Socioeconomic History  . Marital status: Single    Spouse name: Not on file  . Number of children: 0  . Years of education: Not on file  . Highest education level: Master's degree (e.g., MA, MS, MEng, MEd, MSW, MBA)  Occupational History    Comment: na  Tobacco Use  . Smoking status: Former Smoker    Packs/day: 0.50    Years: 22.00    Pack years: 11.00    Types: Cigarettes    Quit date:  09/23/2015    Years since quitting: 4.0  . Smokeless tobacco: Never Used  Substance and Sexual Activity  . Alcohol use: Never  . Drug use: Never  . Sexual activity: Never    Birth control/protection: None  Other Topics Concern  . Not on file  Social History Narrative   Lives with brother   Caffeine- 2-3 c daily   Social Determinants of Health   Financial Resource Strain:   . Difficulty of Paying Living  Expenses:   Food Insecurity:   . Worried About Programme researcher, broadcasting/film/video in the Last Year:   . Barista in the Last Year:   Transportation Needs:   . Freight forwarder (Medical):   Marland Kitchen Lack of Transportation (Non-Medical):   Physical Activity:   . Days of Exercise per Week:   . Minutes of Exercise per Session:   Stress:   . Feeling of Stress :   Social Connections:   . Frequency of Communication with Friends and Family:   . Frequency of Social Gatherings with Friends and Family:   . Attends Religious Services:   . Active Member of Clubs or Organizations:   . Attends Banker Meetings:   Marland Kitchen Marital Status:   Intimate Partner Violence:   . Fear of Current or Ex-Partner:   . Emotionally Abused:   Marland Kitchen Physically Abused:   . Sexually Abused:      PHYSICAL EXAM  GENERAL EXAM/CONSTITUTIONAL: Vitals:  Vitals:   09/24/19 1324  BP: 120/76  Pulse: 91  Temp: (!) 96.4 F (35.8 C)  Weight: 209 lb (94.8 kg)  Height: 5\' 5"  (1.651 m)     Body mass index is 34.78 kg/m. Wt Readings from Last 3 Encounters:  09/24/19 209 lb (94.8 kg)  02/05/18 (!) 319 lb (144.7 kg)  01/17/16 (!) 355 lb (161 kg)     Patient is in no distress; well developed, nourished and groomed; neck is supple  CARDIOVASCULAR:  Examination of carotid arteries is normal; no carotid bruits  Regular rate and rhythm, no murmurs  Examination of peripheral vascular system by observation and palpation is normal  EYES:  Ophthalmoscopic exam of optic discs and posterior segments is normal;  no papilledema or hemorrhages  No exam data present  MUSCULOSKELETAL:  Gait, strength, tone, movements noted in Neurologic exam below  NEUROLOGIC: MENTAL STATUS:  No flowsheet data found.  awake, alert, oriented to person, place and time  recent and remote memory intact  normal attention and concentration  language fluent, comprehension intact, naming intact  fund of knowledge appropriate  CRANIAL NERVE:   2nd - no papilledema on fundoscopic exam  2nd, 3rd, 4th, 6th - pupils equal and reactive to light, visual fields full to confrontation, extraocular muscles intact, no nystagmus  5th - facial sensation symmetric  7th - facial strength symmetric  8th - hearing intact  9th - palate elevates symmetrically, uvula midline  11th - shoulder shrug symmetric  12th - tongue protrusion midline  MOTOR:   normal bulk and tone, full strength in the BUE, BLE  SENSORY:   normal and symmetric to light touch, temperature, vibration  COORDINATION:   finger-nose-finger, fine finger movements normal  REFLEXES:   deep tendon reflexes trace and symmetric  GAIT/STATION:   narrow based gait     DIAGNOSTIC DATA (LABS, IMAGING, TESTING) - I reviewed patient records, labs, notes, testing and imaging myself where available.  Lab Results  Component Value Date   WBC 7.8 02/05/2018   HGB 11.9 (L) 02/05/2018   HCT 38.1 02/05/2018   MCV 92.3 02/05/2018   PLT 285 02/05/2018      Component Value Date/Time   NA 139 02/05/2018 1955   K 3.5 02/05/2018 1955   CL 103 02/05/2018 1955   CO2 24 02/05/2018 1955   GLUCOSE 136 (H) 02/05/2018 1955   BUN 12 02/05/2018 1955   CREATININE 0.75 02/05/2018 1955   CREATININE 0.73 12/12/2014 1512   CALCIUM 9.6 02/05/2018  1955   PROT 7.2 07/14/2014 0540   ALBUMIN 4.4 07/14/2014 0540   AST 24 07/14/2014 0540   ALT 24 07/14/2014 0540   ALKPHOS 56 07/14/2014 0540   BILITOT 0.3 07/14/2014 0540   GFRNONAA >60 02/05/2018 1955   GFRAA  >60 02/05/2018 1955   No results found for: CHOL, HDL, LDLCALC, LDLDIRECT, TRIG, CHOLHDL No results found for: HKGO7P No results found for: VITAMINB12 Lab Results  Component Value Date   TSH 3.233 12/12/2014     B12 > 1525 TSH 2.474   ASSESSMENT AND PLAN  44 y.o. year old female here with:  Dx:  1. Temperature intolerance      PLAN:  OVERHEATING / HEAT INTOLERANCE / ANHIDROSIS / COLD INTOLERANCE (could be related to significant weight loss following bariatric surgery in 2019) - check MRI brain (rule out central causes) - consider endocrinology evaluation  Return for pending if symptoms worsen or fail to improve.    Suanne Marker, MD 09/24/2019, 1:49 PM Certified in Neurology, Neurophysiology and Neuroimaging  [facility] [address] [place], [place] [address] [phone]

## 2019-10-03 ENCOUNTER — Other Ambulatory Visit: Payer: Self-pay

## 2019-10-03 ENCOUNTER — Ambulatory Visit (HOSPITAL_COMMUNITY)
Admission: EM | Admit: 2019-10-03 | Discharge: 2019-10-03 | Disposition: A | Discharge status: Home / Self Care | Payer: Medicare Other | Attending: Internal Medicine | Admitting: Internal Medicine

## 2019-10-14 ENCOUNTER — Other Ambulatory Visit: Payer: Self-pay

## 2019-10-14 ENCOUNTER — Encounter: Payer: Self-pay | Admitting: *Deleted

## 2019-10-14 ENCOUNTER — Ambulatory Visit
Admission: EM | Admit: 2019-10-14 | Discharge: 2019-10-14 | Disposition: A | Discharge status: Home / Self Care | Payer: Medicare Other

## 2019-10-14 NOTE — ED Provider Notes (Signed)
  [name]    CSN: [id number] Arrival date & time: 10/14/19  1349  Patient not evaluated and treated at [facility] today. Patient presented for refills of controlled medications Adderall and Klonopin. Per PMP aware patient had a 1 month refill completed of Klonopin 5/10. UC doesn't refill Adderall.  Patient advised to keep upcoming appt with psychiatrist on 10/22/19. No charge for visit as patient was not evaluation or treated today.    Bing Neighbors, FNP 10/14/19 1627

## 2019-10-14 NOTE — ED Triage Notes (Signed)
Pt requesting med refills, as her prescribing physician abruptly retired.  Requesting Adderall - ran out 2 wks ago; and Klonopin - about to run out in 1 wk. Has appt with new psychiatrist 10/22/19.

## 2019-10-29 ENCOUNTER — Other Ambulatory Visit: Payer: Medicare Other

## 2020-03-12 ENCOUNTER — Ambulatory Visit (HOSPITAL_COMMUNITY): Payer: Self-pay

## 2020-05-21 ENCOUNTER — Ambulatory Visit (HOSPITAL_COMMUNITY)
Admission: EM | Admit: 2020-05-21 | Discharge: 2020-05-21 | Disposition: A | Discharge status: Home / Self Care | Payer: Medicare Other | Attending: Family Medicine | Admitting: Family Medicine

## 2020-05-21 DIAGNOSIS — Z20822 Contact with and (suspected) exposure to covid-19: Secondary | ICD-10-CM | POA: Diagnosis not present

## 2020-05-21 NOTE — Discharge Instructions (Signed)

## 2020-05-21 NOTE — ED Triage Notes (Signed)
Patient requesting covid test, no known exposure.  Feeling lethargic.  Declines to see provider

## 2020-05-22 LAB — SARS CORONAVIRUS 2 (TAT 6-24 HRS): SARS Coronavirus 2: NEGATIVE

## 2021-06-16 ENCOUNTER — Other Ambulatory Visit: Payer: Self-pay | Admitting: Physician Assistant

## 2021-06-16 DIAGNOSIS — Z1231 Encounter for screening mammogram for malignant neoplasm of breast: Secondary | ICD-10-CM

## 2021-06-22 ENCOUNTER — Ambulatory Visit
Admission: RE | Admit: 2021-06-22 | Discharge: 2021-06-22 | Disposition: A | Discharge status: Home / Self Care | Payer: Medicare Other | Source: Ambulatory Visit | Attending: Physician Assistant | Admitting: Physician Assistant

## 2021-06-22 DIAGNOSIS — Z1231 Encounter for screening mammogram for malignant neoplasm of breast: Secondary | ICD-10-CM

## 2021-06-24 ENCOUNTER — Other Ambulatory Visit: Payer: Self-pay | Admitting: Physician Assistant

## 2021-06-24 DIAGNOSIS — R928 Other abnormal and inconclusive findings on diagnostic imaging of breast: Secondary | ICD-10-CM

## 2021-07-06 ENCOUNTER — Ambulatory Visit
Admission: RE | Admit: 2021-07-06 | Discharge: 2021-07-06 | Disposition: A | Discharge status: Home / Self Care | Payer: Medicare Other | Source: Ambulatory Visit | Attending: Physician Assistant | Admitting: Physician Assistant

## 2021-07-06 DIAGNOSIS — R928 Other abnormal and inconclusive findings on diagnostic imaging of breast: Secondary | ICD-10-CM

## 2021-07-07 ENCOUNTER — Encounter: Payer: Self-pay | Admitting: Dietician

## 2021-07-07 ENCOUNTER — Other Ambulatory Visit: Payer: Self-pay

## 2021-07-07 ENCOUNTER — Encounter: Payer: Medicare Other | Attending: Physician Assistant | Admitting: Dietician

## 2021-07-07 VITALS — Ht 65.5 in | Wt 225.6 lb

## 2021-07-07 DIAGNOSIS — R635 Abnormal weight gain: Secondary | ICD-10-CM | POA: Insufficient documentation

## 2021-07-07 DIAGNOSIS — E161 Other hypoglycemia: Secondary | ICD-10-CM | POA: Diagnosis not present

## 2021-07-07 DIAGNOSIS — Z9884 Bariatric surgery status: Secondary | ICD-10-CM | POA: Diagnosis not present

## 2021-07-07 DIAGNOSIS — E119 Type 2 diabetes mellitus without complications: Secondary | ICD-10-CM | POA: Insufficient documentation

## 2021-07-07 NOTE — Patient Instructions (Addendum)
Try to stick to a Carb:Protein ratio of 1:1  1 serving of carbs is 15 grams 1 serving of protein is 7 grams  Look for your average glucose to stay in the range of 100-110. Your goal A1c is 5.0-5.5%  Continue eating about every 3 hours!  Begin to recognize carbohydrates, proteins, and non-starchy vegetables in your food choices!  Begin to build your meals using the proportions of the Balanced Plate. First, select your carb choice(s) for the meal. Make this 25% of your meal. Next, select your source of protein to pair with your carb choice(s). Make this another 25% of your meal. Finally, complete your meal with a variety of non-starchy vegetables. Make this the remaining 50% of your meal.  Proportions NOT portions!

## 2021-07-07 NOTE — Progress Notes (Signed)
Medical Nutrition Therapy  Appointment Start time:  1030  Appointment End time:  1145  Primary concerns today: Reactive Hypoglycemia, weight loss Referral diagnosis: E11.9 - Type 2 Diabetes Mellitus Preferred learning style: Auditory, Visual Learning readiness: Ready   NUTRITION ASSESSMENT   Anthropometrics  Wt: 225.6 lbs Ht: 5'5.5" Body mass index is 36.97 kg/m.  Clinical Medical Hx: T2DM, GERD, Gastric Sleeve (2019) Medications: Amlodipine, Rosuvastatin, Omeprazole Labs: A1c - 4.7% Notable Signs/Symptoms: Hypoglycemia  Lifestyle & Dietary Hx Pt reports concern over reactive hypoglycemia, and recent weight gain. Pt had gastric sleeve done in 2019, states hypoglycemia has increased since then. Pt is currently using Freestyle Libre2 CGM, 90 day average glucose is 97. CBG during visit was 81, pt ate 1 slice of cheese about 30 minutes ago and oatmeal with PB for breakfast 3 hours prior. Pt reports food intolerances to red meat and lactose, symptoms include intense reflux. Pt reports following a high protein diet, very little carbs. Pt reports fear over eating carbs due to the RD they worked with prior to their bariatric surgery. Pt eats mostly complex carbs, whole grains. Pt reports not drinking enough fluids each day, 40-50 oz a day. No SSB. Pt reports liking lots of non-starchy vegetables, has no issues increasing them.   Estimated daily fluid intake: 48 oz Supplements: Daily MVI Sleep: Reasonably well, 6-7 hours Stress / self-care: Mild, GAD/MDD Current average weekly physical activity: ADLs,   24-Hr Dietary Recall First Meal: Oatmeal w/PB, coffee w/equal Snack: 1 tortilla with ground [place] Second Meal: [place] sandwich on whole wheat bread Snack: none Third Meal: none Snack: none Beverages: coffee, water   NUTRITION DIAGNOSIS  NB-1.1 Food and nutrition-related knowledge deficit As related to reactive hypoglycemia.  As evidenced by frequent bouts of glucose below  70, and self-reported fear of carbohydrates.   NUTRITION INTERVENTION  Nutrition education (E-1) on the following topics:  Educated patient on the balanced plate eating model. Recommended lunch and dinner be 1/2 non-starchy vegetables, 1/4 starches, and 1/4 protein. Recommended breakfast be a balance of starch and protein with a piece of fruit. Discussed with patient the importance of working towards hitting the proportions of the balanced plate consistently. Counseled patient on ways to begin recognizing each of the food groups from the balanced plate in their own meals, and how close they are to fitting the recommended proportions of the balanced plate. Educated patient on the nutritional value of each food group on the balanced plate model. Counseled patient on beginning to rebuild their trust in themselves to make the right food choices for their health. Educated patient on mindful eating, including listening to their body's hunger and satiety cues, as well as eating slowly and allowing meals to be more of a sensory experience. Counseled patient on allowing themselves to be present in their emotions when they consider emotional eating. Advised patient to evaluate whether the impulse to eat is hunger based, or emotionally driven. Educated patient on the role of carbs, protein, and veggies on their glycemic control. Advised patient to gradually increase carb intake until proper blood glucose range is achieved.    Handouts Provided Include  Choose Your Foods list Hypoglycemia Nutrition Therapy Stress Response MyPlate  Learning Style & Readiness for Change Teaching method utilized: Visual & Auditory  Demonstrated degree of understanding via: Teach Back  Barriers to learning/adherence to lifestyle change: Fear a/w carbs  Goals Established by Pt Try to stick to a Carb:Protein ratio of 1:1  1 serving of carbs is 15  grams 1 serving of protein is 7 grams Look for your average glucose to stay in  the range of 100-110. Your goal A1c is 5.0-5.5% Continue eating about every 3 hours! Begin to recognize carbohydrates, proteins, and non-starchy vegetables in your food choices! Begin to build your meals using the proportions of the Balanced Plate. First, select your carb choice(s) for the meal. Make this 25% of your meal. Next, select your source of protein to pair with your carb choice(s). Make this another 25% of your meal. Finally, complete your meal with a variety of non-starchy vegetables. Make this the remaining 50% of your meal. Proportions NOT portions!   MONITORING & EVALUATION Dietary intake, weekly physical activity, and hypoglycemic events in 6 weeks.  Next Steps  Patient is to track food choices and their instances of hypoglycemia, follow up with RD.

## 2021-07-27 ENCOUNTER — Ambulatory Visit: Payer: Medicare Other | Admitting: Dietician

## 2021-09-02 ENCOUNTER — Ambulatory Visit: Payer: Medicare Other | Admitting: Dietician

## 2021-09-29 ENCOUNTER — Encounter: Payer: Medicare Other | Attending: Physician Assistant | Admitting: Skilled Nursing Facility1

## 2021-09-29 ENCOUNTER — Encounter: Payer: Self-pay | Admitting: Skilled Nursing Facility1

## 2021-09-29 DIAGNOSIS — E119 Type 2 diabetes mellitus without complications: Secondary | ICD-10-CM | POA: Diagnosis present

## 2021-09-29 NOTE — Progress Notes (Signed)
Medical Nutrition Therapy  Appointment Start time:  1030  Appointment End time:  1145  Primary concerns today: Reactive Hypoglycemia, weight loss Referral diagnosis: E11.9 - Type 2 Diabetes Mellitus Preferred learning style: Auditory, Visual Learning readiness: Ready   NUTRITION ASSESSMENT   Anthropometrics  Wt: pt declined  Ht: 5'5.5"   Clinical Medical Hx: T2DM, GERD, Gastric Sleeve (2019) Medications: Amlodipine, Rosuvastatin, Omeprazole Labs: A1c - 5.3% Notable Signs/Symptoms: Hypoglycemia  Lifestyle & Dietary Hx  Pt reports concern over reactive hypoglycemia, and recent weight gain. Pt had gastric sleeve done in 2019.  Pt states she has had to abandon the keto diet she was doing due to high protein spiking her blood sugar stating it bothers her she gained 30 pounds since stopping keto so about 6 months of time.  Pt states she is no longer wearing the CGM: previously if only protein low blood sugars if eating CHO and protein gets about low 100. Pt states she does check her blood sugars morning: high 90's low 100's, noon: before eating 110-115 up to 130-150 10-15 minutes after eating and then falls back down to 110, night: before eating 110-115 morning::if feeling dizzy, confused, lightheaded: signs of hypoglycemia (70-80).  Pt states she did work with an endocrinologist and has an upcoming appt.  Pt states she gets confused easily when there is a lot of choices so defaults to frozen meals and they are easy to know exactly what is in there Pt state she eats something every 3 hours.   Pt states sometimes she will eat non starchy vegetables if she is feeling a low coming on: Dietitian educated pt on this topic  Pt states sweet veggies like corn can cause a blood sugar drop feeling.   Pt states she goes to bed around 7 pm and wakes around 4 am.   Pt states she is working with a therapist about a fear of eating and weight gain. Recognizes unhealthy attitudes towards food stating  she knew about this before surgery.   Pt states she has gotten Better about learning from the Internet   Pt states she experiences Eating carbs and feeling guilty: uncertain and guilty  Pt states she recognizes she has a clash between Logic brain verses emotional brain when it comes to her relationship with food.   Estimated daily fluid intake: 48 oz Supplements: Daily MVI Sleep: Reasonably well, 6-7 hours Stress / self-care: Mild, GAD/MDD Current average weekly physical activity: ADLs,   24-Hr Dietary Recall First Meal: better oats 100 calorie Oatmeal w/1T PB, coffee w/equal Snack:  Second Meal: frozen  Snack 2 or 3: fiber one bar or 1 street taco tortilla + sugar free jelly sometimes with sugar free chocolate chips and peanut butter Third Meal: occassionally one slice pizza  Snack:  Beverages: coffee, water   NUTRITION DIAGNOSIS  NB-1.1 Food and nutrition-related knowledge deficit As related to reactive hypoglycemia.  As evidenced by frequent bouts of glucose below 70, and self-reported fear of carbohydrates.   NUTRITION INTERVENTION Continued Nutrition education (E-1) on the following topics:  Educated patient on the balanced plate eating model. Recommended lunch and dinner be 1/2 non-starchy vegetables, 1/4 starches, and 1/4 protein. Recommended breakfast be a balance of starch and protein with a piece of fruit. Discussed with patient the importance of working towards hitting the proportions of the balanced plate consistently. Counseled patient on ways to begin recognizing each of the food groups from the balanced plate in their own meals, and how close they are  to fitting the recommended proportions of the balanced plate. Educated patient on the nutritional value of each food group on the balanced plate model. Counseled patient on beginning to rebuild their trust in themselves to make the right food choices for their health. Educated patient on mindful eating, including listening  to their body's hunger and satiety cues, as well as eating slowly and allowing meals to be more of a sensory experience. Counseled patient on allowing themselves to be present in their emotions when they consider emotional eating. Advised patient to evaluate whether the impulse to eat is hunger based, or emotionally driven. Educated patient on the role of carbs, protein, and veggies on their glycemic control. Advised patient to gradually increase carb intake until proper blood glucose range is achieved.  Why you need complex carbohydrates: Whole grains and other complex carbohydrates are required to have a healthy diet. Whole grains provide fiber which can help with blood glucose levels and help keep you satiated. Fruits and starchy vegetables provide essential vitamins and minerals required for immune function, eyesight support, brain support, bone density, wound healing and many other functions within the body. According to the current evidenced based 2020-2025 Dietary Guidelines for Americans, complex carbohydrates are part of a healthy eating pattern which is associated with a decreased risk for type 2 diabetes, cancers, and cardiovascular disease.  Encouraged patient to honor their body's internal hunger and fullness cues.  Throughout the day, check in mentally and rate hunger. Stop eating when satisfied not full regardless of how much food is left on the plate.  Get more if still hungry 20-30 minutes later.  The key is to honor satisfaction so throughout the meal, rate fullness factor and stop when comfortably satisfied not physically full. The key is to honor hunger and fullness without any feelings of guilt or shame.  Pay attention to what the internal cues are, rather than any external factors. This will enhance the confidence you have in listening to your own body and following those internal cues enabling you to increase how often you eat when you are hungry not out of appetite and stop when you are  satisfied not full.  Encouraged pt to continue to eat balanced meals inclusive of non starchy vegetables 2 times a day 7 days a week Encouraged pt to choose lean protein sources: limiting beef, pork, sausage, hotdogs, and lunch meat Encourage pt to choose healthy fats such as plant based limiting animal fats Encouraged pt to continue to drink a minium 64 fluid ounces with half being plain water to satisfy proper hydration   Handouts Previously Provided Include  Choose Your Foods list Hypoglycemia Nutrition Therapy Stress Response MyPlate 09/29/2021 Meal ideas sheet  Learning Style & Readiness for Change Teaching method utilized: Visual & Auditory  Demonstrated degree of understanding via: Teach Back  Barriers to learning/adherence to lifestyle change: Fear a/w carbs  Goals Established by Pt Stick to a Carb:Protein ratio of 1:1 15g CHO:7g Protein  Create balanced meals with meal ideas sheet  Next Steps  Patient is to track food choices and their instances of hypoglycemia, follow up with RD.

## 2021-11-18 ENCOUNTER — Other Ambulatory Visit: Payer: Self-pay

## 2021-11-18 ENCOUNTER — Encounter (HOSPITAL_BASED_OUTPATIENT_CLINIC_OR_DEPARTMENT_OTHER): Payer: Self-pay

## 2021-11-18 ENCOUNTER — Emergency Department (HOSPITAL_BASED_OUTPATIENT_CLINIC_OR_DEPARTMENT_OTHER)
Admission: EM | Admit: 2021-11-18 | Discharge: 2021-11-18 | Disposition: A | Discharge status: Home / Self Care | Payer: Medicare Other | Attending: Emergency Medicine | Admitting: Emergency Medicine

## 2021-11-18 DIAGNOSIS — Z7984 Long term (current) use of oral hypoglycemic drugs: Secondary | ICD-10-CM | POA: Insufficient documentation

## 2021-11-18 DIAGNOSIS — Z79899 Other long term (current) drug therapy: Secondary | ICD-10-CM | POA: Insufficient documentation

## 2021-11-18 DIAGNOSIS — H538 Other visual disturbances: Secondary | ICD-10-CM | POA: Diagnosis not present

## 2021-11-18 DIAGNOSIS — X58XXXA Exposure to other specified factors, initial encounter: Secondary | ICD-10-CM | POA: Insufficient documentation

## 2021-11-18 DIAGNOSIS — E119 Type 2 diabetes mellitus without complications: Secondary | ICD-10-CM | POA: Diagnosis not present

## 2021-11-18 DIAGNOSIS — S0502XA Injury of conjunctiva and corneal abrasion without foreign body, left eye, initial encounter: Secondary | ICD-10-CM | POA: Insufficient documentation

## 2021-11-18 MED ORDER — TETRACAINE HCL 0.5 % OP SOLN
1.0000 [drp] | Freq: Once | OPHTHALMIC | Status: AC
  Administered 2021-11-18: 2 [drp] via OPHTHALMIC
  Filled 2021-11-18: qty 4

## 2021-11-18 MED ORDER — FLUORESCEIN SODIUM 1 MG OP STRP
1.0000 | ORAL_STRIP | Freq: Once | OPHTHALMIC | Status: AC
  Administered 2021-11-18: 1 via OPHTHALMIC
  Filled 2021-11-18: qty 1

## 2021-11-18 MED ORDER — CIPROFLOXACIN HCL 0.3 % OP SOLN
1.0000 [drp] | OPHTHALMIC | Status: DC
Start: 2021-11-18 — End: 2021-11-18
  Administered 2021-11-18: 1 [drp] via OPHTHALMIC
  Filled 2021-11-18: qty 2.5

## 2021-11-18 NOTE — ED Triage Notes (Addendum)
Pt presents to ED with acute onset of blurred vision. Pt reports going to bed at 10pm last night, woke up 0500 with bilateral blurred vision and Left eye pain. Pt describes it as a smoky haze.  Pt denies any other symptoms. Pt ambulatory into triage.    LKW 10 pm last night

## 2021-11-18 NOTE — ED Provider Notes (Signed)
[facility] EMERGENCY DEPT Provider Note   CSN: [id number] Arrival date & time: 11/18/21  0755     History  Chief Complaint  Patient presents with   Blurred Vision    Patricia Calderon is a 46 y.o. female.  HPI 46 year old female history of type 2 diabetes, morbid obesity, status post bariatric surgery, reports of some recent hypoglycemia, presents today complaining of pain and vision changes.  She states on awakening this morning she had some irritation and foreign body sensation to her left eye.  She noticed some blurring of vision in both her eyes.  She has not had any lateralized weakness, headache, and is not on blood thinners.  He does take amlodipine.  She has had vision correction surgery several years ago by Dr. Delaney Meigs.  She is followed by Dr. Nile Riggs for ophthalmology.  She does not report any history of glaucoma.  She does use eye wetting drops.  She states that she felt like maybe her eyes were dry this morning and used several drops in each eye.  She has felt there was some smoking this to the vision in the left eye.  There have been no visual field deficits.  He is not seeing double.  She is not running a fever and does not have any infectious symptoms with no crusting or discharge.  There is no definite episode of getting a foreign body in her eye.    Home Medications Prior to Admission medications   Medication Sig Start Date End Date Taking? Authorizing Provider  acetaminophen (TYLENOL) 325 MG tablet Take 650 mg by mouth every 6 (six) hours as needed for mild pain.    [provider]  AMLODIPINE BENZOATE PO Take 5 mg by mouth daily.    [provider]  amphetamine-dextroamphetamine (ADDERALL) 20 MG tablet Take 20 mg by mouth 2 (two) times daily.    [provider]  Biotin 1 MG CAPS Take by mouth daily.    [provider]  Cholecalciferol (VITAMIN D3) 10 MCG (400 UNIT) CAPS Take by mouth.    [provider]   clonazePAM (KLONOPIN) 0.5 MG tablet Take 0.5 mg by mouth 2 (two) times daily as needed for anxiety.    [provider]  cyclobenzaprine (FLEXERIL) 5 MG tablet Take 1-2 tablets (5-10 mg total) by mouth 2 (two) times daily as needed for muscle spasms. Patient not taking: Reported on 09/24/2019 05/26/18   Wieters, Hallie C, PA-C  fluticasone (FLONASE) 50 MCG/ACT nasal spray Place 1 spray into both nostrils daily. 07/24/17   Georgetta Haber, NP  gabapentin (NEURONTIN) 300 MG capsule Take 300 mg by mouth 3 (three) times daily.    [provider]  hydrochlorothiazide (HYDRODIURIL) 25 MG tablet TAKE 1 TABLET BY MOUTH DAILY 08/29/19   [provider]  lamoTRIgine (LAMICTAL) 200 MG tablet 200 mg daily. 08/22/19   [provider]  Melatonin 1 MG CAPS Take by mouth as needed.    [provider]  metFORMIN (GLUCOPHAGE-XR) 500 MG 24 hr tablet Take 1,000 mg by mouth 2 (two) times daily.     [provider]  methocarbamol (ROBAXIN) 500 MG tablet as needed. 08/29/19   [provider]  Multiple Vitamins-Minerals (MULTIVITAMIN WITH MINERALS) tablet Take 1 tablet by mouth daily. bariatric    [provider]  ondansetron (ZOFRAN-ODT) 4 MG disintegrating tablet DISSOLVE 1 TABLET(4 MG) ON THE TONGUE EVERY 8 HOURS FOR UP TO 7 DAYS AS NEEDED FOR NAUSEA 09/23/19  [provider]  pantoprazole (PROTONIX) 40 MG tablet Take 40 mg by mouth daily.    [provider]  rosuvastatin (CRESTOR) 10 MG tablet TAKE 1 TABLET(10 MG) BY MOUTH DAILY 07/31/19   [provider]  sildenafil (REVATIO) 20 MG tablet Take by mouth 2 (two) times daily. 08/05/19   [provider]  sodium chloride (OCEAN) 0.65 % SOLN nasal spray Place 1 spray into both nostrils as needed for congestion. 07/24/17   Georgetta Haber, NP  tobramycin (TOBREX) 0.3 % ophthalmic ointment Place 1 application into the left eye 3 (three) times daily. Patient not taking:  Reported on 09/24/2019 02/23/18   Elvina Sidle, MD  UNABLE TO FIND Med Name: testosterone cream 4.0 mg 2 x weekly    [provider]      Allergies    Nsaids, Hydrocodone-acetaminophen, Lexapro [escitalopram oxalate], Lidocaine, Procaine, Citalopram, and Nickel    Review of Systems   Review of Systems  Physical Exam Updated Vital Signs BP 128/69 (BP Location: Left Arm)   Pulse 84   Temp 98.2 F (36.8 C) (Oral)   Resp 16   SpO2 100%  Physical Exam Vitals reviewed.  Constitutional:      General: She is not in acute distress.    Appearance: Normal appearance. She is not ill-appearing.  HENT:     Head: Normocephalic and atraumatic.     Right Ear: Tympanic membrane and external ear normal.     Left Ear: Tympanic membrane and external ear normal.     Mouth/Throat:     Mouth: Mucous membranes are moist.  Eyes:     General:        Left eye: Foreign body present.    Intraocular pressure: Right eye pressure is 20 mmHg. Left eye pressure is 20 mmHg. Measurements were taken using a handheld tonometer.    Extraocular Movements: Extraocular movements intact.     Right eye: Normal extraocular motion.     Left eye: Normal extraocular motion.     Conjunctiva/sclera:     Left eye: Left conjunctiva is injected.     Pupils: Pupils are equal, round, and reactive to light.     Right eye: Pupil is not sluggish.     Left eye: Pupil is not sluggish. Corneal abrasion and fluorescein uptake present.     Funduscopic exam:    Right eye: Red reflex present.        Left eye: No hemorrhage or exudate. Red reflex present.    Slit lamp exam:    Right eye: Anterior chamber quiet.     Left eye: Anterior chamber quiet.     Comments: Slight irregularity of left pupil Mild injection left upper lid  Neurological:     Mental Status: She is alert.     ED Results / Procedures / Treatments   Labs (all labs ordered are listed, but only abnormal results are displayed) Labs Reviewed - No data  to display  EKG None  Radiology No results found.  Procedures Procedures    Medications Ordered in ED Medications  ciprofloxacin (CILOXAN) 0.3 % ophthalmic solution 1 drop (has no administration in time range)  tetracaine (PONTOCAINE) 0.5 % ophthalmic solution 1-2 drop (2 drops Both Eyes Given 11/18/21 0823)  fluorescein ophthalmic strip 1 strip (1 strip Both Eyes Given 11/18/21 5361)    ED Course/ Medical Decision Making/ A&P  Medical Decision Making 46 year old female presents today with some eye pain on the left and foreign body sensation.  Visual fields and visual acuity somewhat decreased on left.  She has some sensation of blurring on right breath suspect this is secondary to her eyedrops. Bilaterally her intraocular pressure is 20. She is advised of this and advised to have this rechecked at her ophthalmologist today. She appears to have a small corneal abrasion on the left and an eyelash was removed from the eye Plan antibiotic drops and close follow-up with ophthalmology Patient advised of return precautions and need for follow-up and voices understanding  Risk Prescription drug management.           Final Clinical Impression(s) / ED Diagnoses Final diagnoses:  Abrasion of left cornea, initial encounter  Blurry vision, bilateral    Rx / DC Orders ED Discharge Orders     None         Margarita Grizzle, MD 11/18/21 [phone]

## 2021-11-18 NOTE — Discharge Instructions (Addendum)
Please call your ophthalmologist for recheck today The pressure in both your eyes was 20 You appear to have a corneal abrasion of the left eye and are being started on antibiotic drops. Return if you are having worsening symptoms or new symptoms such as headache, weakness, or worsening vision

## 2022-02-11 ENCOUNTER — Inpatient Hospital Stay (HOSPITAL_COMMUNITY): Admission: RE | Admit: 2022-02-11 | Payer: Self-pay | Source: Ambulatory Visit

## 2022-02-11 ENCOUNTER — Encounter (HOSPITAL_COMMUNITY): Payer: Self-pay | Admitting: *Deleted

## 2022-02-11 ENCOUNTER — Ambulatory Visit (HOSPITAL_COMMUNITY)
Admission: EM | Admit: 2022-02-11 | Discharge: 2022-02-11 | Disposition: A | Discharge status: Home / Self Care | Payer: Medicare Other | Attending: Internal Medicine | Admitting: Internal Medicine

## 2022-02-11 DIAGNOSIS — Z20822 Contact with and (suspected) exposure to covid-19: Secondary | ICD-10-CM | POA: Diagnosis not present

## 2022-02-11 DIAGNOSIS — J069 Acute upper respiratory infection, unspecified: Secondary | ICD-10-CM | POA: Diagnosis not present

## 2022-02-11 LAB — RESP PANEL BY RT-PCR (FLU A&B, COVID) ARPGX2
Influenza A by PCR: NEGATIVE
Influenza B by PCR: NEGATIVE
SARS Coronavirus 2 by RT PCR: NEGATIVE

## 2022-02-11 MED ORDER — CETIRIZINE HCL 10 MG PO TABS: 10.0000 mg | ORAL_TABLET | Freq: Every day | ORAL | 2 refills | Status: DC

## 2022-02-11 MED ORDER — ACETAMINOPHEN 325 MG PO TABS
ORAL_TABLET | ORAL | Status: AC
Start: 2022-02-11 — End: ?
  Filled 2022-02-11: qty 3

## 2022-02-11 MED ORDER — ACETAMINOPHEN 325 MG PO TABS
975.0000 mg | ORAL_TABLET | Freq: Once | ORAL | Status: AC
  Administered 2022-02-11: 975 mg via ORAL

## 2022-02-11 NOTE — ED Triage Notes (Signed)
Pts father has covid and being admitted to [facility]. She is having loss of taste, headache, dizzy her sx started 2-3 days ago. She is taking OTC meds without relief.

## 2022-02-11 NOTE — ED Provider Notes (Signed)
[facility]    CSN: [id number] Arrival date & time: 02/11/22  1152      History   Chief Complaint Chief Complaint  Patient presents with   Covid Exposure    HPI Patricia Calderon is a 46 y.o. female.   Patient presents to urgent care for evaluation of headache, loss of taste and smell, and fatigue that started 2 days ago. Headache is generalized and is currently a 5.5 on a scale of 0-10.  Denies new vision changes or new dizziness. Denies sore throat, nasal congestion, fever/chills, cough, body aches, shortness of breath, chest pain, nausea, vomiting, abdominal pain, diarrhea, and eye drainage.  Her dad and brother have been sick with similar symptoms and her dad is currently in the hospital due to COVID-19 illness. Denies history of asthma or chronic respiratory problems. Has a history of allergic rhinitis for which she takes zyrtec or Claritin for as needed. Patient is not a smoker and denies drug use. They are vaccinated against COVID-19 and they have not (have/have not) received their seasonal flu vaccine this year. Has attempted use of benadryl and tylenol prior to arrival at urgent care for relief of symptoms with some relief.      Past Medical History:  Diagnosis Date   ADHD    Anhidrosis    Anxiety    Bipolar 1 disorder (HCC)    Depression    Diabetes mellitus without complication (HCC)    controlled with diet   Eczema    GERD (gastroesophageal reflux disease)    History of electroconvulsive therapy 05/2016   8 sessions   History of suicidal ideation    Hot flashes    Hypercholesteremia    Hypertension    MDD (major depressive disorder)    Raynaud's syndrome    Spinal stenosis    Vitamin D deficiency     Patient Active Problem List   Diagnosis Date Noted   Esophageal reflux 08/31/2014    Past Surgical History:  Procedure Laterality Date   BARIATRIC SURGERY  03/13/2018   DENTAL SURGERY     8 years ago   WISDOM TOOTH EXTRACTION      OB  History   No obstetric history on file.      Home Medications    Prior to Admission medications   Medication Sig Start Date End Date Taking? Authorizing Provider  acetaminophen (TYLENOL) 325 MG tablet Take 650 mg by mouth every 6 (six) hours as needed for mild pain.   Yes [provider]  AMLODIPINE BENZOATE PO Take 5 mg by mouth daily.   Yes [provider]  cetirizine (ZYRTEC) 10 MG tablet Take 1 tablet (10 mg total) by mouth daily. 02/11/22  Yes Carlisle Beers, FNP  clonazePAM (KLONOPIN) 0.5 MG tablet Take 0.5 mg by mouth 2 (two) times daily as needed for anxiety.   Yes [provider]  fluticasone (FLONASE) 50 MCG/ACT nasal spray Place 1 spray into both nostrils daily. 07/24/17  Yes Burky, Dorene Grebe B, NP  hydrochlorothiazide (HYDRODIURIL) 25 MG tablet TAKE 1 TABLET BY MOUTH DAILY 08/29/19  Yes [provider]  Multiple Vitamins-Minerals (MULTIVITAMIN WITH MINERALS) tablet Take 1 tablet by mouth daily. bariatric   Yes [provider]  pantoprazole (PROTONIX) 40 MG tablet Take 40 mg by mouth daily.   Yes [provider]  rosuvastatin (CRESTOR) 10 MG tablet TAKE 1 TABLET(10 MG) BY MOUTH DAILY 07/31/19  Yes [provider]  amphetamine-dextroamphetamine (ADDERALL) 20 MG tablet Take  20 mg by mouth 2 (two) times daily.    [provider]  Biotin 1 MG CAPS Take by mouth daily.    [provider]  Cholecalciferol (VITAMIN D3) 10 MCG (400 UNIT) CAPS Take by mouth.    [provider]  cyclobenzaprine (FLEXERIL) 5 MG tablet Take 1-2 tablets (5-10 mg total) by mouth 2 (two) times daily as needed for muscle spasms. Patient not taking: Reported on 09/24/2019 05/26/18   Wieters, Hallie C, PA-C  gabapentin (NEURONTIN) 300 MG capsule Take 300 mg by mouth 3 (three) times daily.    [provider]  lamoTRIgine (LAMICTAL) 200 MG tablet 200 mg daily. 08/22/19   [provider]  Melatonin 1 MG CAPS Take  by mouth as needed.    [provider]  metFORMIN (GLUCOPHAGE-XR) 500 MG 24 hr tablet Take 1,000 mg by mouth 2 (two) times daily.     [provider]  methocarbamol (ROBAXIN) 500 MG tablet as needed. 08/29/19   [provider]  ondansetron (ZOFRAN-ODT) 4 MG disintegrating tablet DISSOLVE 1 TABLET(4 MG) ON THE TONGUE EVERY 8 HOURS FOR UP TO 7 DAYS AS NEEDED FOR NAUSEA 09/23/19   [provider]  sildenafil (REVATIO) 20 MG tablet Take by mouth 2 (two) times daily. 08/05/19   [provider]  sodium chloride (OCEAN) 0.65 % SOLN nasal spray Place 1 spray into both nostrils as needed for congestion. 07/24/17   Georgetta Haber, NP  tobramycin (TOBREX) 0.3 % ophthalmic ointment Place 1 application into the left eye 3 (three) times daily. Patient not taking: Reported on 09/24/2019 02/23/18   Elvina Sidle, MD  UNABLE TO FIND Med Name: testosterone cream 4.0 mg 2 x weekly    [provider]    Family History Family History  Problem Relation Age of Onset   Breast cancer Mother 42   Diabetes Mother    Cancer Mother        breast   Alzheimer's disease Mother    Depression Mother    Diabetes Father    Heart disease Father    Heart attack Father    Depression Father     Social History Social History   Tobacco Use   Smoking status: Former    Packs/day: 0.50    Years: 22.00    Total pack years: 11.00    Types: Cigarettes    Quit date: 09/23/2015    Years since quitting: 6.3   Smokeless tobacco: Never  Vaping Use   Vaping Use: Some days  Substance Use Topics   Alcohol use: Not Currently   Drug use: Not Currently     Allergies   Nsaids, Hydrocodone-acetaminophen, Lexapro [escitalopram oxalate], Lidocaine, Procaine, Citalopram, and Nickel   Review of Systems Review of Systems Per HPI  Physical Exam Triage Vital Signs ED Triage Vitals  Enc Vitals Group     BP 02/11/22 1220 119/87     Pulse Rate 02/11/22 1220 80     Resp  02/11/22 1220 18     Temp 02/11/22 1220 98.3 F (36.8 C)     Temp Source 02/11/22 1220 Oral     SpO2 02/11/22 1220 98 %     Weight --      Height --      Head Circumference --      Peak Flow --      Pain Score 02/11/22 1217 0     Pain Loc --      Pain Edu? --  Excl. in GC? --    No data found.  Updated Vital Signs BP 119/87 (BP Location: Right Arm)   Pulse 80   Temp 98.3 F (36.8 C) (Oral)   Resp 18   LMP 02/09/2022 (Exact Date)   SpO2 98%   Visual Acuity Right Eye Distance:   Left Eye Distance:   Bilateral Distance:    Right Eye Near:   Left Eye Near:    Bilateral Near:     Physical Exam Vitals and nursing note reviewed.  Constitutional:      Appearance: She is not ill-appearing or toxic-appearing.  HENT:     Head: Normocephalic and atraumatic.     Right Ear: Hearing, tympanic membrane, ear canal and external ear normal.     Left Ear: Hearing, tympanic membrane, ear canal and external ear normal.     Nose: Rhinorrhea present.     Mouth/Throat:     Lips: Pink.     Mouth: Mucous membranes are moist.     Pharynx: No posterior oropharyngeal erythema.     Comments: Mild erythema to posterior oropharynx with small amount of clear postnasal drainage visualized. Airway intact and patent. Eyes:     General: Lids are normal. Vision grossly intact. Gaze aligned appropriately.        Right eye: No discharge.        Left eye: No discharge.     Extraocular Movements: Extraocular movements intact.     Conjunctiva/sclera: Conjunctivae normal.     Pupils: Pupils are equal, round, and reactive to light.  Cardiovascular:     Rate and Rhythm: Normal rate and regular rhythm.     Heart sounds: Normal heart sounds, S1 normal and S2 normal.  Pulmonary:     Effort: Pulmonary effort is normal. No respiratory distress.     Breath sounds: Normal breath sounds and air entry.  Musculoskeletal:     Cervical back: Neck supple.  Lymphadenopathy:     Cervical: Cervical adenopathy  present.  Skin:    General: Skin is warm and dry.     Capillary Refill: Capillary refill takes less than 2 seconds.     Findings: No rash.  Neurological:     General: No focal deficit present.     Mental Status: She is alert and oriented to person, place, and time. Mental status is at baseline.     Cranial Nerves: No dysarthria or facial asymmetry.     Motor: No weakness.     Gait: Gait normal.  Psychiatric:        Mood and Affect: Mood normal.        Speech: Speech normal.        Behavior: Behavior normal.        Thought Content: Thought content normal.        Judgment: Judgment normal.      UC Treatments / Results  Labs (all labs ordered are listed, but only abnormal results are displayed) Labs Reviewed  RESP PANEL BY RT-PCR (FLU A&B, COVID) [id number]    EKG   Radiology No results found.  Procedures Procedures (including critical care time)  Medications Ordered in UC Medications  acetaminophen (TYLENOL) tablet 975 mg (975 mg Oral Given 02/11/22 1330)    Initial Impression / Assessment and Plan / UC Course  I have reviewed the triage vital signs and the nursing notes.  Pertinent labs & imaging results that were available during my care of the patient were reviewed by me and considered in  my medical decision making (see chart for details).  Viral URI with cough Symptoms and physical exam consistent with a viral upper respiratory tract infection that will likely resolve with rest, fluids, and prescriptions for symptomatic relief. No indication for imaging today based on stable cardiopulmonary exam and hemodynamically stable vital signs. COVID-19 and flu testing is pending.  We will call patient if this is positive.  Quarantine guidelines discussed. Currently on day 3 of symptoms and does qualify for antiviral therapy due to comorbidities placing her at elevated risk of severe illness.  Work note given.  Patient given Tylenol in clinic today for headache.   Patient to  begin taking cetirizine allergy medication for seasonal allergies as well as to dry up postnasal drainage.   May use tylenol over the counter for body aches, fever/chills, and overall discomfort associated with viral illness.  She cannot take NSAID medications due to history of bariatric weight loss surgery and GERD.  Nonpharmacologic interventions for symptom relief provided and after visit summary below.   Strict ED/urgent care return precautions given.  Patient verbalizes understanding and agreement with plan.  Counseled patient regarding possible side effects and uses of all medications prescribed at today's visit.  Patient verbalizes understanding and agreement with plan.  All questions answered.  Patient discharged from urgent care in stable condition.       Final Clinical Impressions(s) / UC Diagnoses   Final diagnoses:  Viral URI with cough     Discharge Instructions      Begin taking cetirizine/Claritin (either or) again to help with your nasal drainage and allergy symptoms.  You may take Tylenol every 6 hours as needed for headache and sore throat.  COVID-19 testing is pending and will come back in the next 2 to 3 hours.  I will call you if this is positive.  If it is negative, you will not hear from me.   If you develop any new or worsening symptoms or do not improve in the next 2 to 3 days, please return.  If your symptoms are severe, please go to the emergency room.  Follow-up with your primary care provider for further evaluation and management of your symptoms as well as ongoing wellness visits.  I hope you feel better!  I hope your dad also recovers well!      ED Prescriptions     Medication Sig Dispense Auth. Provider   cetirizine (ZYRTEC) 10 MG tablet Take 1 tablet (10 mg total) by mouth daily. 30 tablet Talbot Grumbling, FNP      PDMP not reviewed this encounter.   Talbot Grumbling, FNP 02/11/22 1400

## 2022-02-11 NOTE — Discharge Instructions (Addendum)
Begin taking cetirizine/Claritin (either or) again to help with your nasal drainage and allergy symptoms.  You may take Tylenol every 6 hours as needed for headache and sore throat.  COVID-19 testing is pending and will come back in the next 2 to 3 hours.  I will call you if this is positive.  If it is negative, you will not hear from me.   If you develop any new or worsening symptoms or do not improve in the next 2 to 3 days, please return.  If your symptoms are severe, please go to the emergency room.  Follow-up with your primary care provider for further evaluation and management of your symptoms as well as ongoing wellness visits.  I hope you feel better!  I hope your dad also recovers well!

## 2022-06-03 ENCOUNTER — Other Ambulatory Visit: Payer: Self-pay | Admitting: Physician Assistant

## 2022-06-03 DIAGNOSIS — Z1231 Encounter for screening mammogram for malignant neoplasm of breast: Secondary | ICD-10-CM

## 2022-07-26 ENCOUNTER — Ambulatory Visit
Admission: RE | Admit: 2022-07-26 | Discharge: 2022-07-26 | Disposition: A | Discharge status: Home / Self Care | Payer: Medicare Other | Source: Ambulatory Visit | Attending: Physician Assistant | Admitting: Physician Assistant

## 2022-07-26 DIAGNOSIS — Z1231 Encounter for screening mammogram for malignant neoplasm of breast: Secondary | ICD-10-CM

## 2022-10-19 ENCOUNTER — Ambulatory Visit: Payer: Self-pay

## 2023-06-28 ENCOUNTER — Other Ambulatory Visit: Payer: Self-pay | Admitting: Family Medicine

## 2023-06-28 DIAGNOSIS — Z1231 Encounter for screening mammogram for malignant neoplasm of breast: Secondary | ICD-10-CM

## 2023-07-27 ENCOUNTER — Ambulatory Visit
Admission: RE | Admit: 2023-07-27 | Discharge: 2023-07-27 | Disposition: A | Discharge status: Home / Self Care | Payer: Medicare Other | Source: Ambulatory Visit | Attending: Family Medicine | Admitting: Family Medicine

## 2023-07-27 DIAGNOSIS — Z1231 Encounter for screening mammogram for malignant neoplasm of breast: Secondary | ICD-10-CM

## 2023-07-30 IMAGING — MG MM DIGITAL SCREENING BILAT W/ TOMO AND CAD
8 series · 9 of 24 positions shown · non-contrast
Comparison: Previous exam(s).

CLINICAL DATA: Screening.

EXAM:
DIGITAL SCREENING BILATERAL MAMMOGRAM WITH TOMOSYNTHESIS AND CAD
TECHNIQUE: Bilateral screening digital craniocaudal and mediolateral oblique
mammograms were obtained. Bilateral screening digital breast
tomosynthesis was performed. The images were evaluated with
computer-aided detection.

[L CC synth-2D]
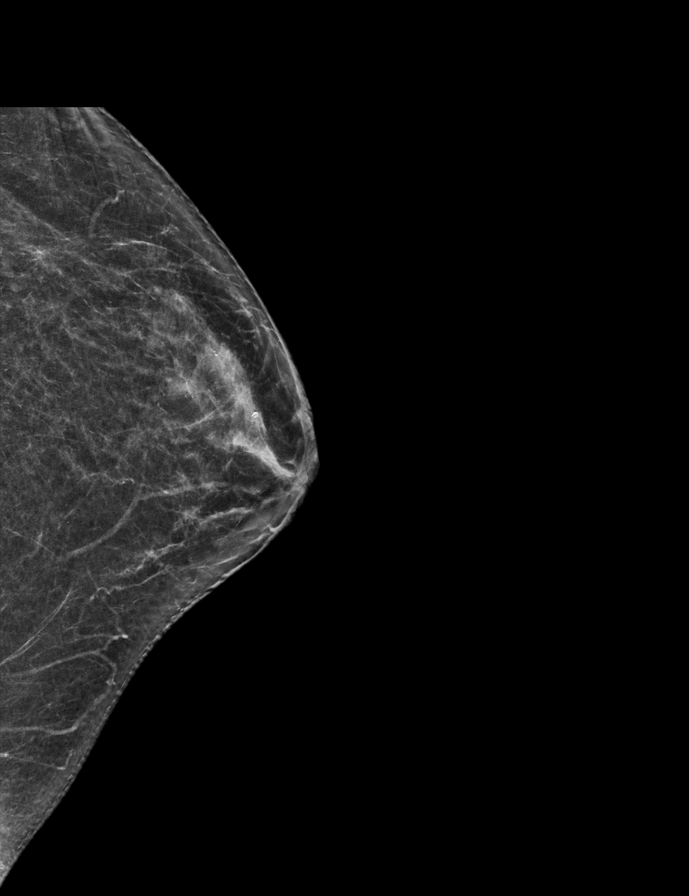

[R CC synth-2D]
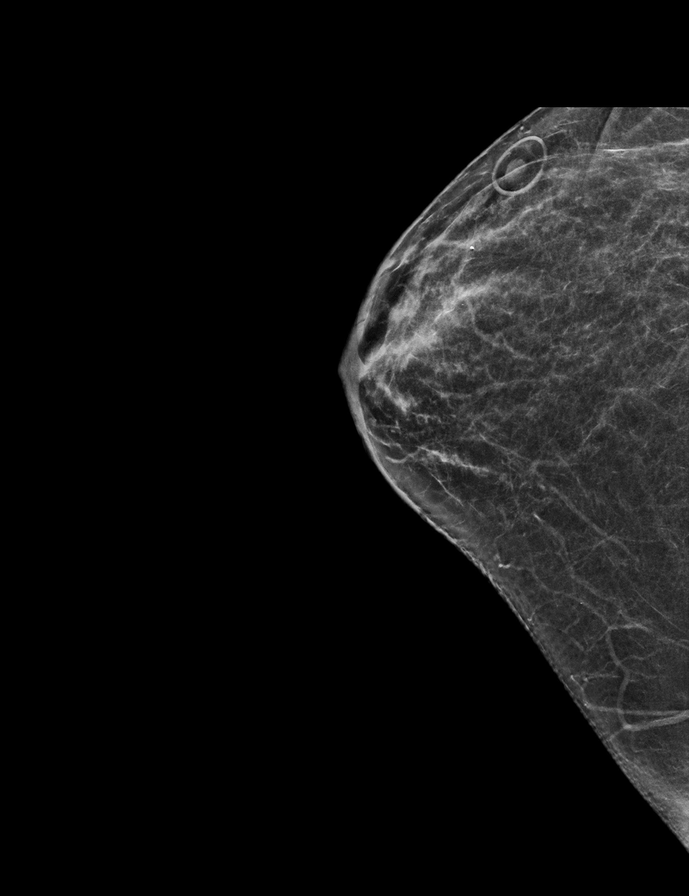

[L MLO synth-2D]
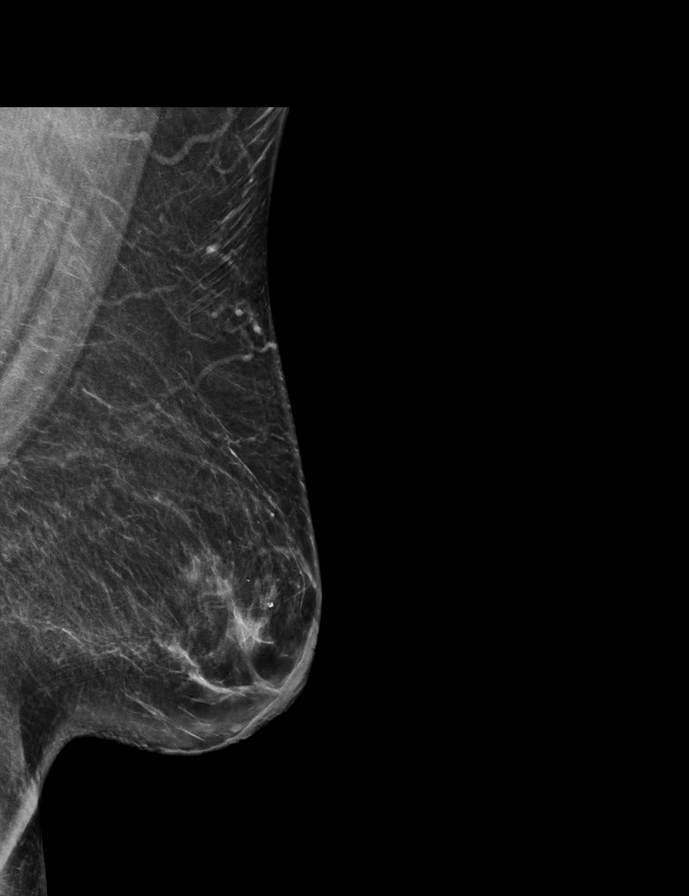

[R MLO synth-2D]
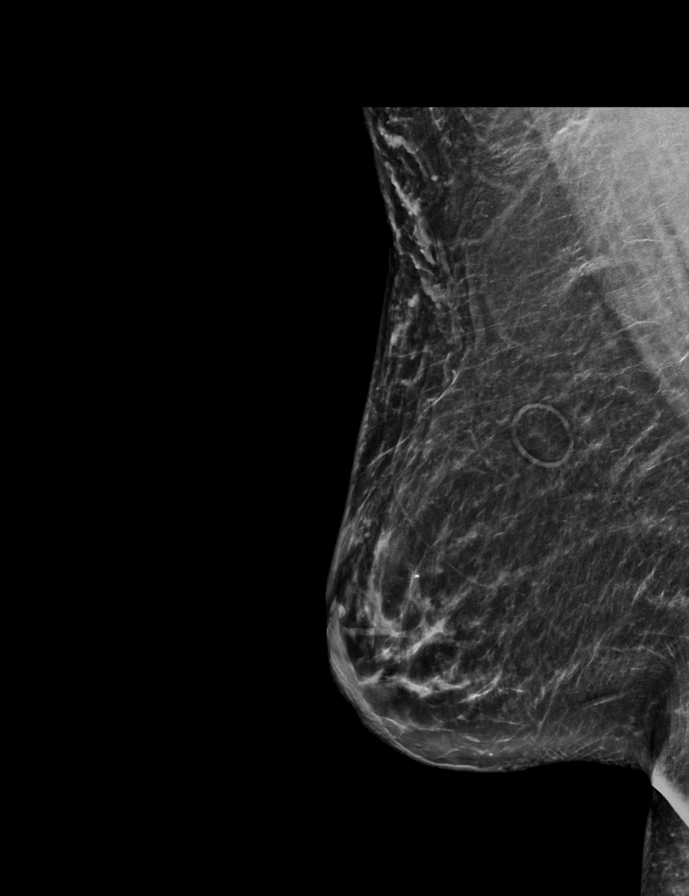

[R CC tomo · 2 of 54 frames shown]
[frame 18/54]
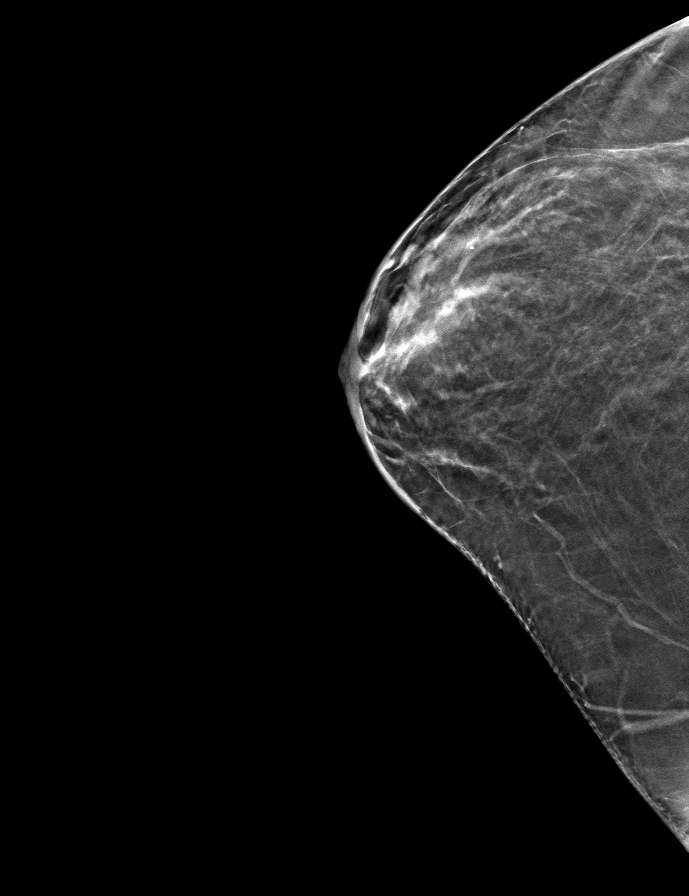
[frame 27/54]
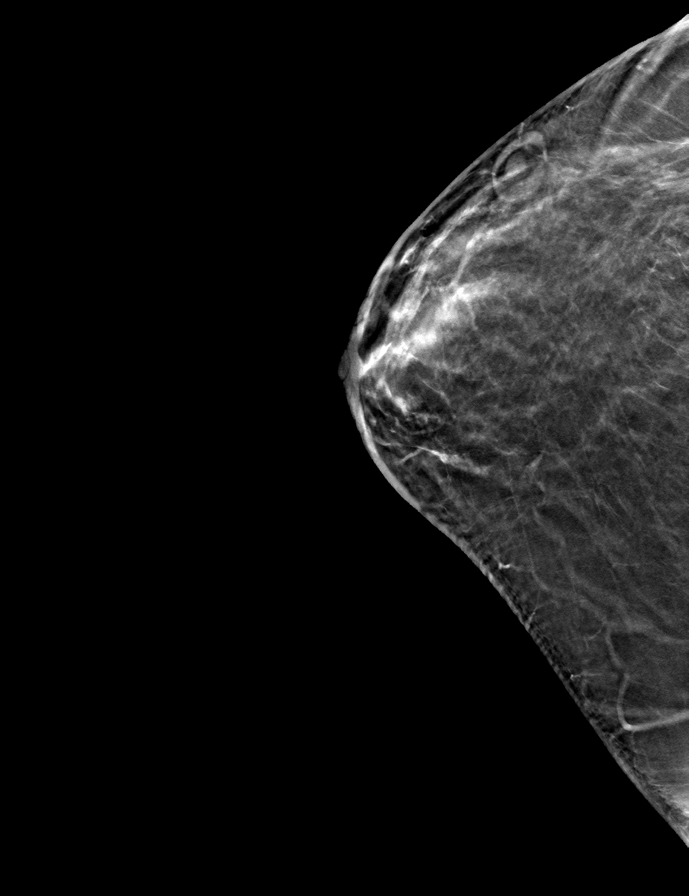

[R MLO tomo · tomo slice 35/70.0]
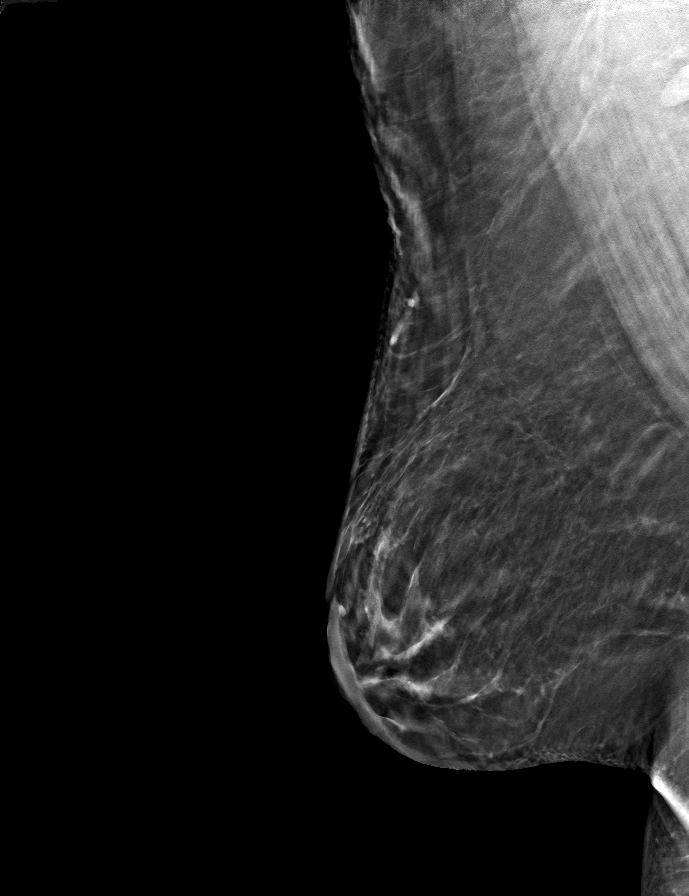

[L MLO tomo · tomo slice 34/67.0]
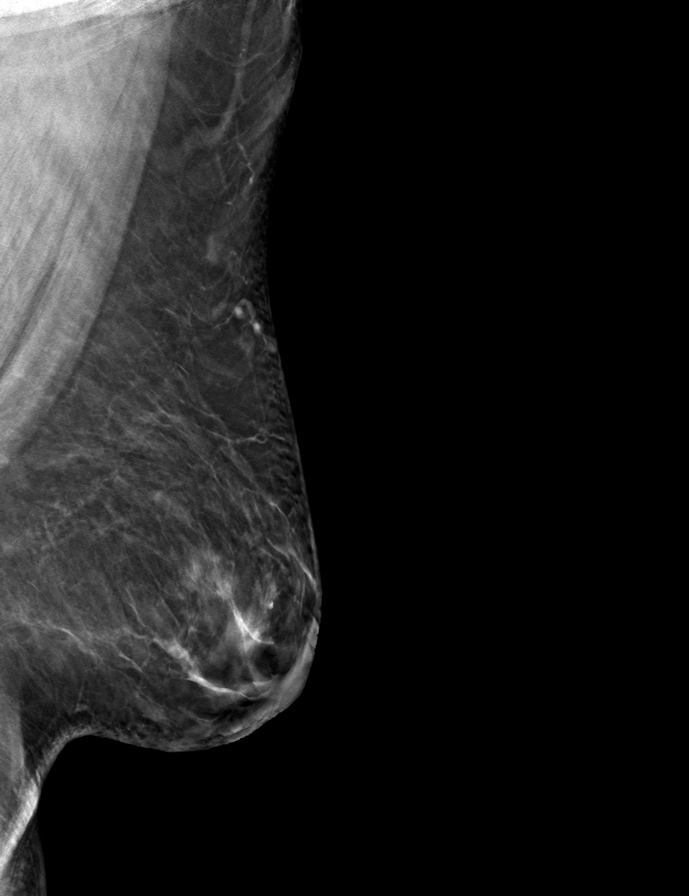

[L CC tomo · tomo slice 27/53.0]
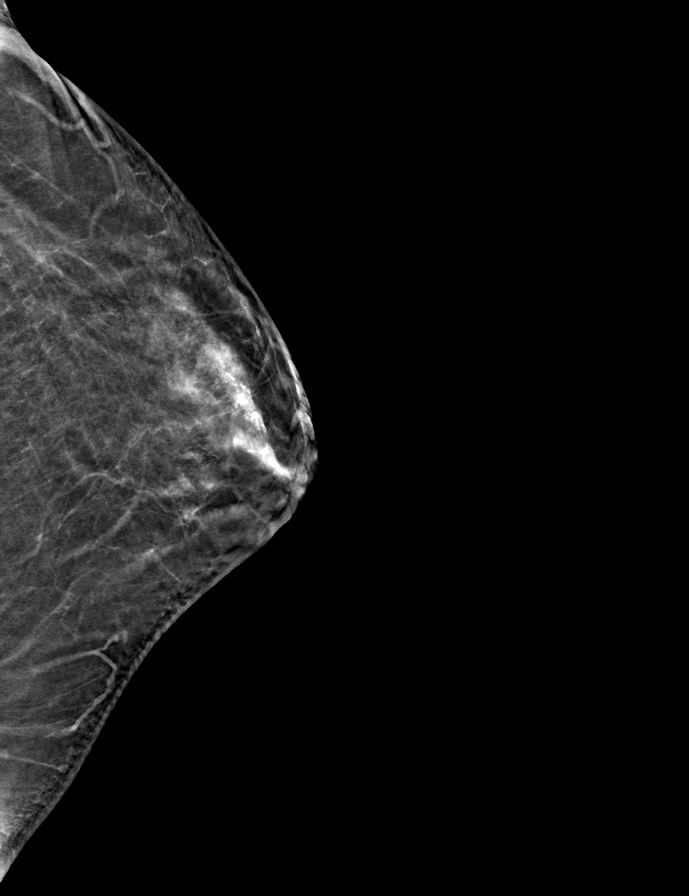

[9 of 24 positions shown; findings below may reference images not displayed]

ACR Breast Density Category b: There are scattered areas of
fibroglandular density.
FINDINGS: In the left breast, calcifications warrant further evaluation. In
the right breast, no findings suspicious for malignancy.
IMPRESSION: Further evaluation is suggested for calcifications in the left
breast.

RECOMMENDATION:
Diagnostic mammogram of the left breast. (Code:EV-1-GGM)

The patient will be contacted regarding the findings, and additional
imaging will be scheduled.

BI-RADS CATEGORY  0: Incomplete. Need additional imaging evaluation
and/or prior mammograms for comparison.

## 2023-08-01 ENCOUNTER — Other Ambulatory Visit: Payer: Self-pay | Admitting: Family Medicine

## 2023-08-01 DIAGNOSIS — R928 Other abnormal and inconclusive findings on diagnostic imaging of breast: Secondary | ICD-10-CM

## 2023-08-08 ENCOUNTER — Ambulatory Visit
Admission: RE | Admit: 2023-08-08 | Discharge: 2023-08-08 | Disposition: A | Discharge status: Home / Self Care | Source: Ambulatory Visit | Attending: Family Medicine | Admitting: Family Medicine

## 2023-08-08 DIAGNOSIS — R928 Other abnormal and inconclusive findings on diagnostic imaging of breast: Secondary | ICD-10-CM

## 2023-08-09 ENCOUNTER — Ambulatory Visit
Admission: RE | Admit: 2023-08-09 | Discharge: 2023-08-09 | Disposition: A | Discharge status: Home / Self Care | Source: Ambulatory Visit | Attending: Family Medicine | Admitting: Family Medicine

## 2023-08-09 VITALS — BP 114/77 | HR 88 | Temp 98.4°F | Resp 17

## 2023-08-09 DIAGNOSIS — J069 Acute upper respiratory infection, unspecified: Secondary | ICD-10-CM

## 2023-08-09 LAB — POC COVID19/FLU A&B COMBO
Covid Antigen, POC: NEGATIVE
Influenza A Antigen, POC: NEGATIVE
Influenza B Antigen, POC: NEGATIVE

## 2023-08-09 NOTE — Discharge Instructions (Signed)
 Results for orders placed or performed during the hospital encounter of 08/09/23  POC Covid19/Flu A&B Antigen   Collection Time: 08/09/23  4:12 PM  Result Value Ref Range   Influenza A Antigen, POC Negative Negative   Influenza B Antigen, POC Negative Negative   Covid Antigen, POC Negative Negative

## 2023-08-09 NOTE — ED Triage Notes (Signed)
 Pt presents with c/o sore throat, facial pain and pressure X 2 days. Pt has had stomach pain, denies vomiting and diarrhea.  States she has a runny nose and has had a lot of mucus drainage. Pt reports she might have post nasal drip Denies fever.  Home interventions: Sudafed, Tylenol, Nasal Spray

## 2023-08-09 NOTE — ED Provider Notes (Signed)
 [facility]   [id number] 08/09/23 Arrival Time: 1421  ASSESSMENT & PLAN:  1. Viral URI    Discussed typical duration of likely viral illness. Results for orders placed or performed during the hospital encounter of 08/09/23  POC Covid19/Flu A&B Antigen   Collection Time: 08/09/23  4:12 PM  Result Value Ref Range   Influenza A Antigen, POC Negative Negative   Influenza B Antigen, POC Negative Negative   Covid Antigen, POC Negative Negative   OTC symptom care as needed. OTC Mucinex.   Follow-up Information     Trisha Mangle, FNP.   Specialty: Family Medicine Why: As needed.        [facility]).   Specialty: Urgent Care Why: As needed. Contact information: [address], [place] [address] [phone]                Reviewed expectations re: course of current medical issues. Questions answered. Outlined signs and symptoms indicating need for more acute intervention. Understanding verbalized. After Visit Summary given.   SUBJECTIVE: History from: Patient. Patricia Calderon is a 48 y.o. female. Pt presents with c/o sore throat, facial pain and pressure X 2 days. Pt has had stomach pain, denies vomiting and diarrhea.  States she has a runny nose and has had a lot of mucus drainage. Pt reports she might have post nasal drip Denies fever.  Home interventions: Sudafed, Tylenol, Nasal Spray Denies: fever and difficulty breathing. Normal PO intake without n/v/d.  OBJECTIVE:  Vitals:   08/09/23 1518  BP: 114/77  Pulse: 88  Resp: 17  Temp: 98.4 F (36.9 C)  TempSrc: Oral  SpO2: 97%    General appearance: alert; no distress Eyes: PERRLA; EOMI; conjunctiva normal HENT: [place]; AT; with nasal congestion; throat with mild cobblestoning Neck: supple  Lungs: speaks full sentences without difficulty; unlabored Extremities: no edema Skin: warm and  dry Neurologic: normal gait Psychological: alert and cooperative; normal mood and affect  Labs: Results for orders placed or performed during the hospital encounter of 08/09/23  POC Covid19/Flu A&B Antigen   Collection Time: 08/09/23  4:12 PM  Result Value Ref Range   Influenza A Antigen, POC Negative Negative   Influenza B Antigen, POC Negative Negative   Covid Antigen, POC Negative Negative   Labs Reviewed  POC COVID19/FLU A&B COMBO    Imaging: No results found.  Allergies  Allergen Reactions   Nsaids Nausea Only    Patient has gastric bypass and was advised not to take NSAIDS   Hydrocodone-Acetaminophen Itching   Lexapro [Escitalopram Oxalate]    Lidocaine Other (See Comments)    shakes   Procaine Other (See Comments)    shakiness   Citalopram Palpitations   Nickel Rash    Past Medical History:  Diagnosis Date   ADHD    Anhidrosis    Anxiety    Bipolar 1 disorder (HCC)    Depression    Diabetes mellitus without complication (HCC)    controlled with diet   Eczema    GERD (gastroesophageal reflux disease)    History of electroconvulsive therapy 05/2016   8 sessions   History of suicidal ideation    Hot flashes    Hypercholesteremia    Hypertension    MDD (major depressive disorder)    Raynaud's syndrome    Spinal stenosis    Vitamin D deficiency    Social History   Socioeconomic History  Marital status: Divorced    Spouse name: Not on file   Number of children: 0   Years of education: Not on file   Highest education level: Master's degree (e.g., MA, MS, MEng, MEd, MSW, MBA)  Occupational History    Comment: na  Tobacco Use   Smoking status: Former    Current packs/day: 0.00    Average packs/day: 0.5 packs/day for 22.0 years (11.0 ttl pk-yrs)    Types: Cigarettes    Start date: 09/22/1993    Quit date: 09/23/2015    Years since quitting: 7.8   Smokeless tobacco: Never  Vaping Use   Vaping status: Some Days  Substance and Sexual Activity    Alcohol use: Not Currently   Drug use: Not Currently   Sexual activity: Not on file  Other Topics Concern   Not on file  Social History Narrative   Lives with brother   Caffeine- 2-3 c daily   Social Drivers of Health   Financial Resource Strain: Not on file  Food Insecurity: Medium Risk (08/24/2022)   Received from [facility]   Hunger Vital Sign    Worried About Running Out of Food in the Last Year: Sometimes true    Ran Out of Food in the Last Year: Sometimes true  Transportation Needs: Unmet Transportation Needs (08/24/2022)   Received from [facility]    In the past 12 months, has lack of reliable transportation kept you from medical appointments, meetings, work or from getting things needed for daily living? : Yes  Physical Activity: Not on file  Stress: Not on file  Social Connections: Not on file  Intimate Partner Violence: Not on file   Family History  Problem Relation Age of Onset   Breast cancer Mother 93   Diabetes Mother    Cancer Mother        breast   Alzheimer's disease Mother    Depression Mother    Diabetes Father    Heart disease Father    Heart attack Father    Depression Father    Past Surgical History:  Procedure Laterality Date   BARIATRIC SURGERY  03/13/2018   DENTAL SURGERY     8 years ago   WISDOM TOOTH EXTRACTION       Mardella Layman, MD 08/09/23 [id number]

## 2023-08-13 IMAGING — MG DIGITAL DIAGNOSTIC UNILAT LEFT W/ CAD
3 series · 3 of 3 positions shown · non-contrast
Comparison: Previous exams including recent screening mammogram
dated 06/22/2021.

CLINICAL DATA: Patient returns today to evaluate LEFT breast
calcifications identified on a recent screening mammogram.

EXAM:
DIGITAL DIAGNOSTIC UNILATERAL LEFT MAMMOGRAM WITH CAD
TECHNIQUE: Left digital diagnostic mammography was performed. Mammographic
images were processed with CAD.

[L ML (1 of 2)]
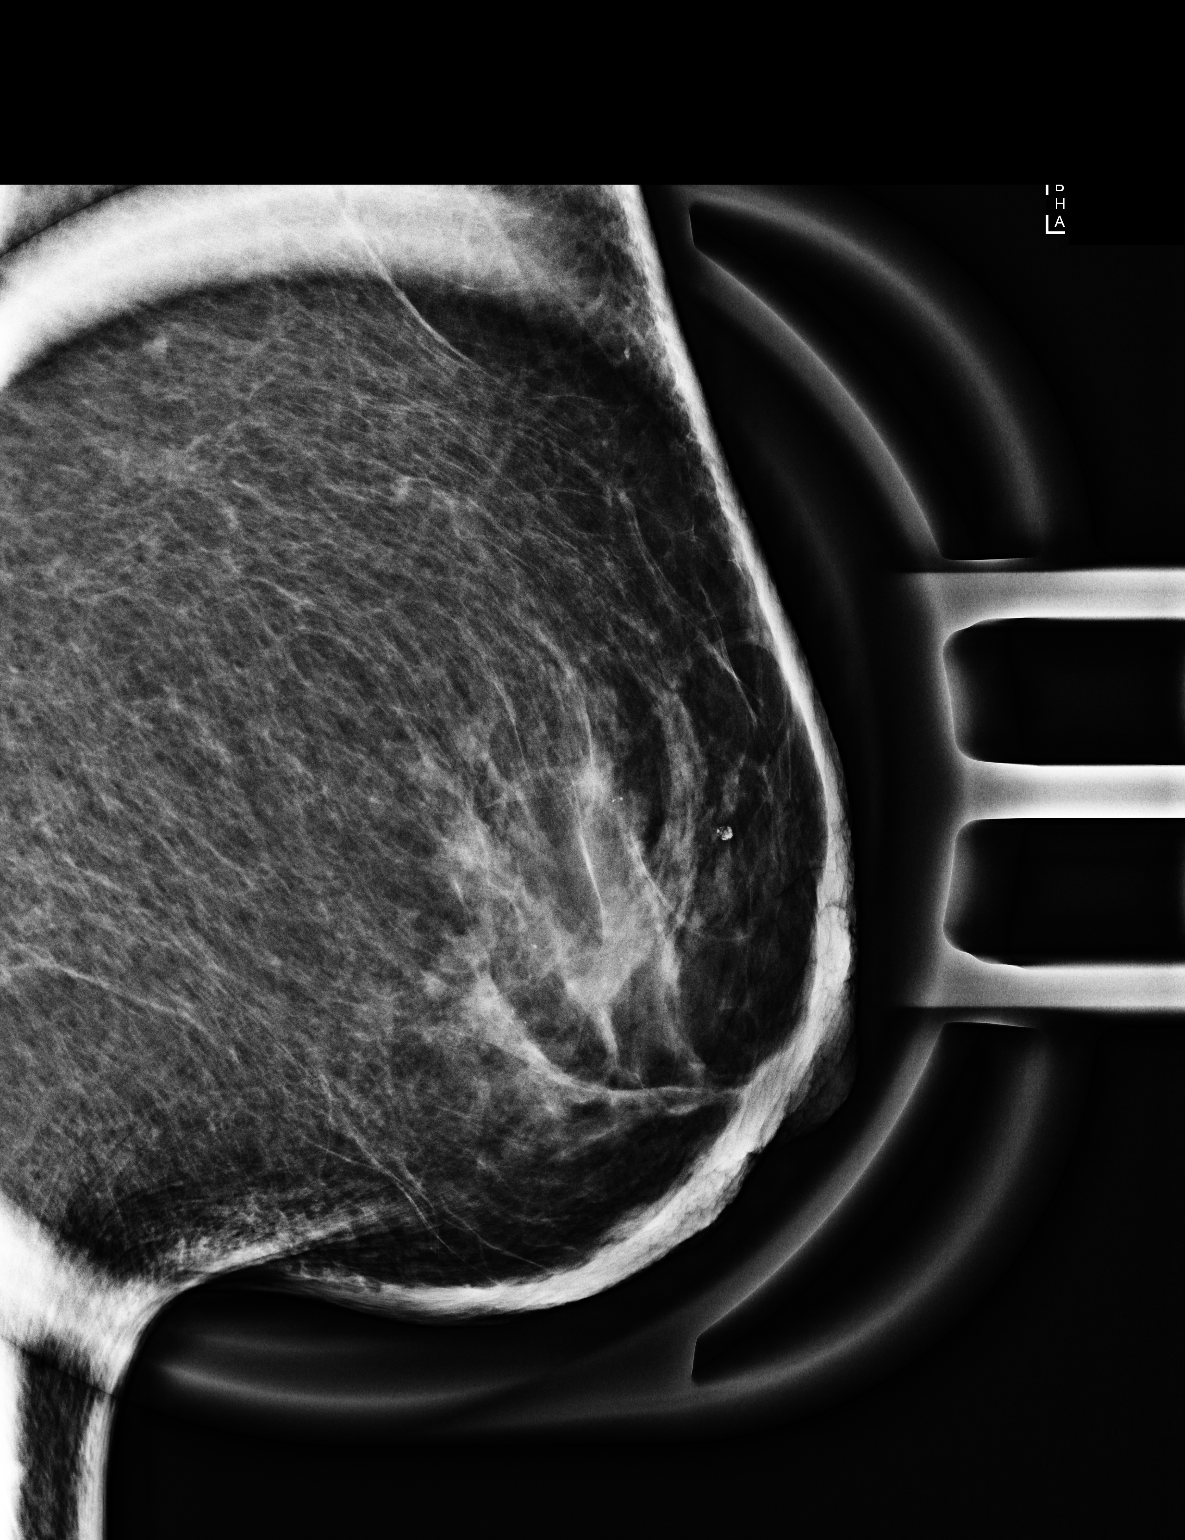

[L CC]
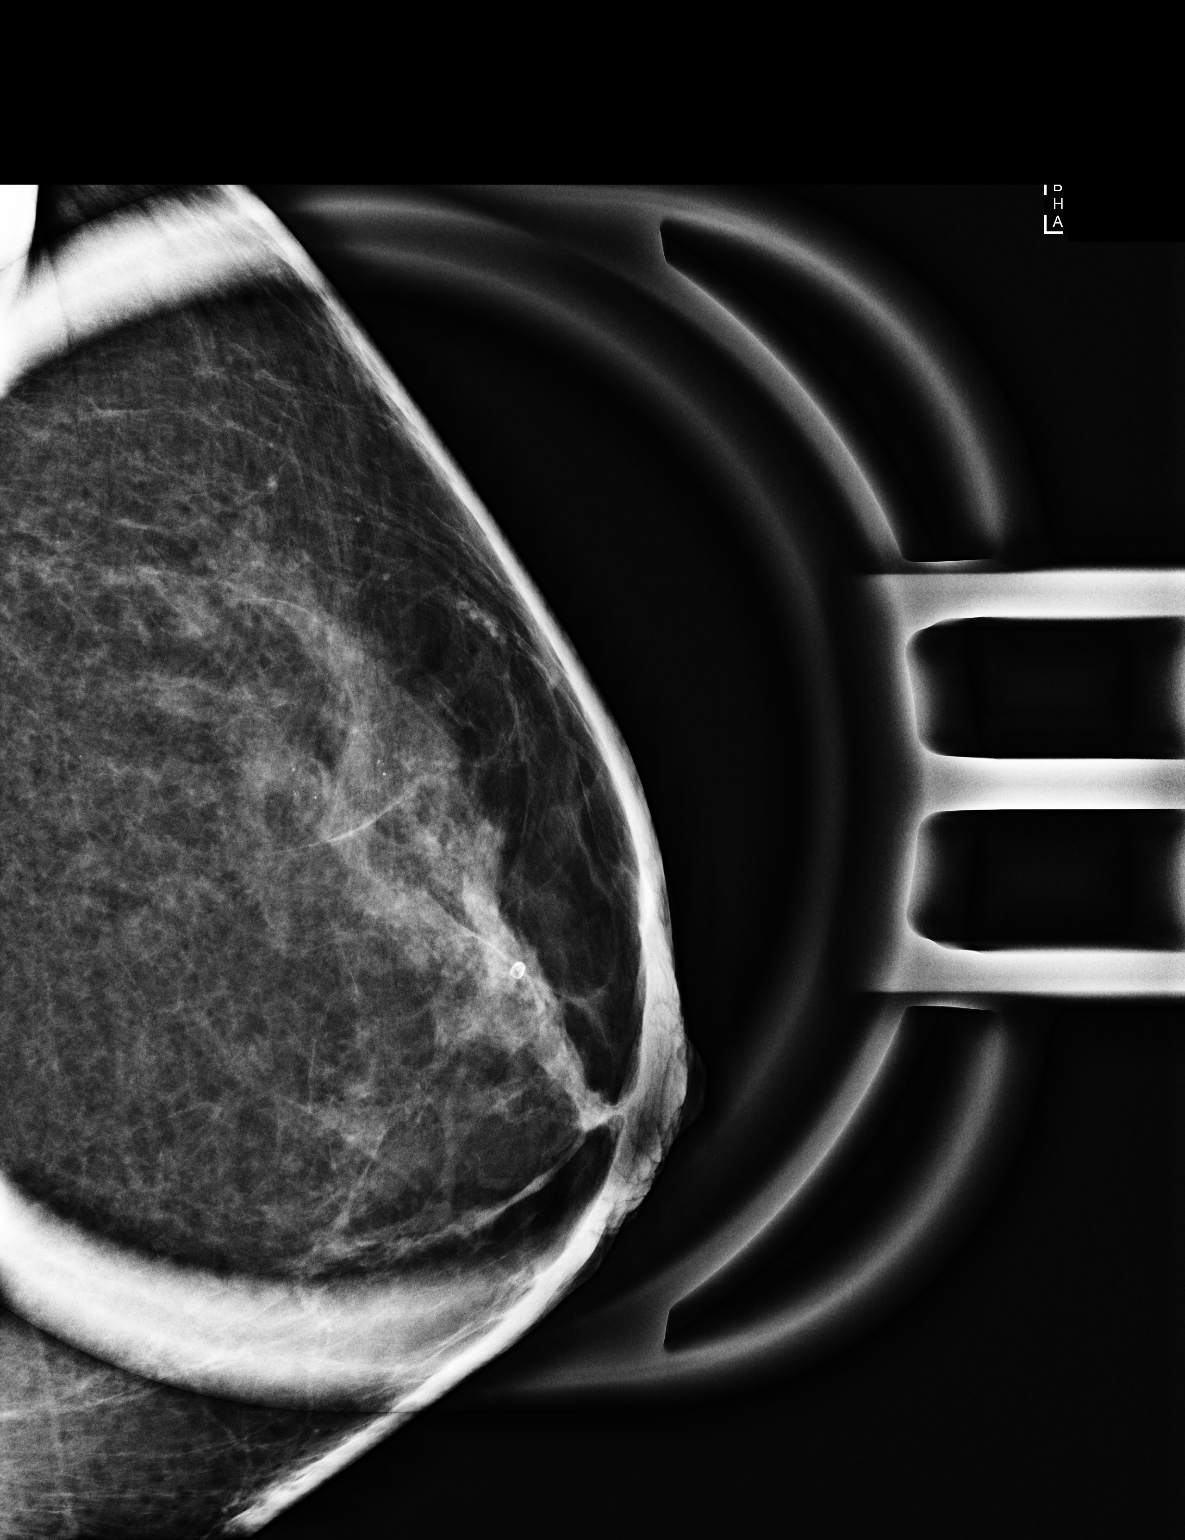

[L ML (2 of 2)]
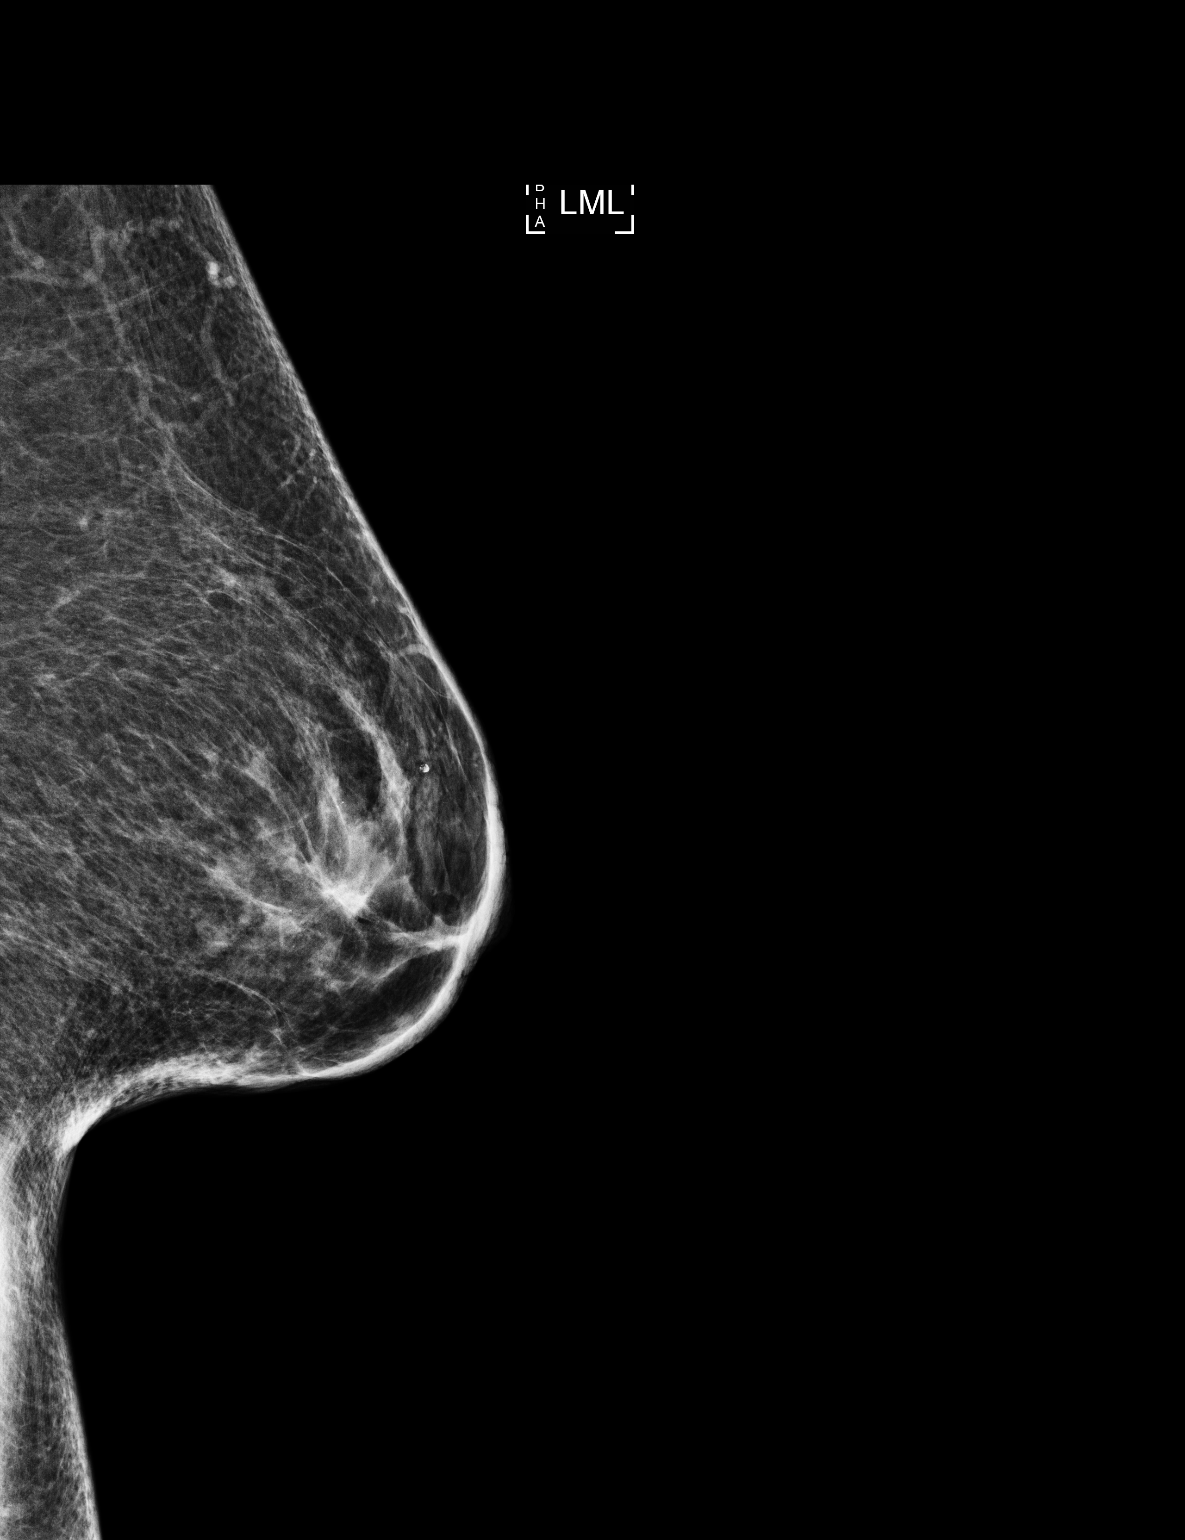

[3 of 3 positions shown; findings below may reference images not displayed]

ACR Breast Density Category b: There are scattered areas of
fibroglandular density.
FINDINGS: On today's additional diagnostic views, including magnification
views, scattered punctate calcifications are confirmed within the
retroareolar to outer LEFT breast. There are no suspicious
pleomorphic or fine linear branching calcifications.
IMPRESSION: No evidence of malignancy. Benign-appearing calcifications within
the LEFT breast.

Patient may return to routine annual bilateral screening mammogram
schedule.

RECOMMENDATION:
Screening mammogram in one year.(Code:[id number])

I have discussed the findings and recommendations with the patient.
If applicable, a reminder letter will be sent to the patient
regarding the next appointment.

BI-RADS CATEGORY  2: Benign.

## 2024-04-21 ENCOUNTER — Encounter (HOSPITAL_COMMUNITY): Payer: Self-pay

## 2024-04-21 ENCOUNTER — Other Ambulatory Visit: Payer: Self-pay

## 2024-04-21 ENCOUNTER — Inpatient Hospital Stay (HOSPITAL_COMMUNITY): Admission: RE | Admit: 2024-04-21 | Discharge: 2024-04-21 | Payer: Self-pay

## 2024-04-21 VITALS — BP 113/72 | HR 84 | Temp 98.3°F | Resp 20

## 2024-04-21 DIAGNOSIS — H6121 Impacted cerumen, right ear: Secondary | ICD-10-CM

## 2024-04-21 DIAGNOSIS — J0140 Acute pansinusitis, unspecified: Secondary | ICD-10-CM | POA: Diagnosis not present

## 2024-04-21 MED ORDER — CARBAMIDE PEROXIDE 6.5 % OT SOLN: 5.0000 [drp] | Freq: Two times a day (BID) | OTIC | 0 refills | Status: AC

## 2024-04-21 MED ORDER — AMOXICILLIN-POT CLAVULANATE 875-125 MG PO TABS: 1.0000 | ORAL_TABLET | Freq: Two times a day (BID) | ORAL | 0 refills | Status: AC

## 2024-04-21 NOTE — ED Provider Notes (Signed)
 [facility]    CSN: [id number] Arrival date & time: 04/21/24  1324      History   Chief Complaint Chief Complaint  Patient presents with   Headache    Flu and Covid Sxs - Entered by patient   Cough   Facial Pain    HPI Patricia Calderon is a 48 y.o. female.   Patient presents today with a week and a half long history of URI symptoms including cough, fatigue, headache, sinus pressure, intermittent scratchy throat.  Denies any fever, chest pain, shortness of breath, vomiting, diarrhea.  She has been taking over-the-counter medication without improvement of symptoms.  She denies any recent antibiotics or steroids.  She reports that her brother has been sick with similar symptoms but denies additional sick contacts.  Denies any history of allergies, asthma, COPD, smoking.  She has no concern for pregnancy as she is status post hysterectomy.  She is having difficulty with daily tibias as a result of symptoms.    Past Medical History:  Diagnosis Date   ADHD    Anhidrosis    Anxiety    Bipolar 1 disorder (HCC)    Depression    Diabetes mellitus without complication (HCC)    controlled with diet   Eczema    GERD (gastroesophageal reflux disease)    History of electroconvulsive therapy 05/2016   8 sessions   History of suicidal ideation    Hot flashes    Hypercholesteremia    Hypertension    MDD (major depressive disorder)    Raynaud's syndrome    Spinal stenosis    Vitamin D  deficiency     Patient Active Problem List   Diagnosis Date Noted   Esophageal reflux 08/31/2014    Past Surgical History:  Procedure Laterality Date   BARIATRIC SURGERY  03/13/2018   DENTAL SURGERY     8 years ago   WISDOM TOOTH EXTRACTION      OB History   No obstetric history on file.      Home Medications    Prior to Admission medications   Medication Sig Start Date End Date Taking? Authorizing Provider  acetaminophen  (TYLENOL ) 325 MG tablet Take 650 mg by mouth every  6 (six) hours as needed for mild pain.   Yes [provider]  AMLODIPINE  BENZOATE PO Take 5 mg by mouth daily.   Yes [provider]  amoxicillin -clavulanate (AUGMENTIN ) 875-125 MG tablet Take 1 tablet by mouth every 12 (twelve) hours. 04/21/24  Yes Aneshia Jacquet K, PA-C  carbamide peroxide (DEBROX) 6.5 % OTIC solution Place 5 drops into the right ear 2 (two) times daily. 04/21/24  Yes Austynn Pridmore K, PA-C  Cholecalciferol (VITAMIN D3) 10 MCG (400 UNIT) CAPS Take by mouth.   Yes [provider]  clonazePAM (KLONOPIN) 0.5 MG tablet Take 0.5 mg by mouth 2 (two) times daily as needed for anxiety.   Yes [provider]  fluticasone  (FLONASE ) 50 MCG/ACT nasal spray Place 1 spray into both nostrils daily. 07/24/17  Yes Burky, Natalie B, NP  hydrochlorothiazide (HYDRODIURIL) 25 MG tablet TAKE 1 TABLET BY MOUTH DAILY 08/29/19  Yes [provider]  Melatonin 1 MG CAPS Take by mouth as needed.   Yes [provider]  Multiple Vitamins-Minerals (MULTIVITAMIN WITH MINERALS) tablet Take 1 tablet by mouth daily. bariatric   Yes [provider]  ondansetron (ZOFRAN-ODT) 4 MG disintegrating tablet DISSOLVE 1 TABLET(4 MG) ON THE TONGUE EVERY 8 HOURS FOR UP TO 7 DAYS  AS NEEDED FOR NAUSEA 09/23/19  Yes [provider]  rosuvastatin (CRESTOR) 10 MG tablet TAKE 1 TABLET(10 MG) BY MOUTH DAILY 07/31/19  Yes [provider]  sodium chloride (OCEAN) 0.65 % SOLN nasal spray Place 1 spray into both nostrils as needed for congestion. 07/24/17  Yes Burky, Natalie B, NP  amphetamine-dextroamphetamine (ADDERALL) 20 MG tablet Take 20 mg by mouth 2 (two) times daily.    [provider]  estradiol (VIVELLE-DOT) 0.1 MG/24HR patch 1 patch 2 (two) times a week.    [provider]  famotidine (PEPCID) 40 MG tablet Take 40 mg by mouth daily.    [provider]  UNABLE TO FIND Med Name: testosterone cream 4.0 mg 2 x weekly    [provider]    Family History Family History  Problem Relation Age of Onset   Breast cancer Mother 70   Diabetes Mother    Cancer Mother        breast   Alzheimer's disease Mother    Depression Mother    Diabetes Father    Heart disease Father    Heart attack Father    Depression Father     Social History Social History   Tobacco Use   Smoking status: Former    Current packs/day: 0.00    Average packs/day: 0.5 packs/day for 22.0 years (11.0 ttl pk-yrs)    Types: Cigarettes    Start date: 09/22/1993    Quit date: 09/23/2015    Years since quitting: 8.5   Smokeless tobacco: Never  Vaping Use   Vaping status: Some Days  Substance Use Topics   Alcohol use: Not Currently   Drug use: Not Currently     Allergies   Nsaids, Hydrocodone -acetaminophen , Lexapro [escitalopram oxalate], Lidocaine , Procaine, Citalopram, and Nickel   Review of Systems Review of Systems  Constitutional:  Positive for activity change. Negative for appetite change, fatigue and fever.  HENT:  Positive for congestion, postnasal drip, sinus pressure and sore throat. Negative for sneezing.   Respiratory:  Positive for cough. Negative for shortness of breath.   Cardiovascular:  Negative for chest pain.  Gastrointestinal:  Positive for nausea. Negative for diarrhea and vomiting.  Musculoskeletal:  Negative for arthralgias and myalgias.  Neurological:  Positive for headaches. Negative for dizziness and light-headedness.     Physical Exam Triage Vital Signs ED Triage Vitals  Encounter Vitals Group     BP 04/21/24 1352 113/72     Girls Systolic BP Percentile --      Girls Diastolic BP Percentile --      Boys Systolic BP Percentile --      Boys Diastolic BP Percentile --      Pulse Rate 04/21/24 1352 84     Resp 04/21/24 1352 20     Temp 04/21/24 1352 98.3 F (36.8 C)     Temp src --      SpO2 04/21/24 1352 97 %     Weight --      Height --      Head Circumference --      Peak Flow --       Pain Score 04/21/24 1345 7     Pain Loc --      Pain Education --      Exclude from Growth Chart --    No data found.  Updated Vital Signs BP 113/72   Pulse 84   Temp 98.3 F (36.8 C)   Resp 20   LMP  (  LMP Unknown)   SpO2 97%   Visual Acuity Right Eye Distance:   Left Eye Distance:   Bilateral Distance:    Right Eye Near:   Left Eye Near:    Bilateral Near:     Physical Exam Vitals reviewed.  Constitutional:      General: She is awake. She is not in acute distress.    Appearance: Normal appearance. She is well-developed. She is not ill-appearing.     Comments: Very pleasant female appears stated age in no acute distress.   HENT:     Head: Normocephalic and atraumatic.     Right Ear: External ear normal. There is impacted cerumen. Tympanic membrane is not erythematous or bulging.     Left Ear: Ear canal and external ear normal. A middle ear effusion is present. Tympanic membrane is not erythematous or bulging.     Ears:     Comments: Right ear: Cerumen impaction noted; unable to visualize TM.  We attempted irrigation but were unsuccessful.    Nose:     Right Sinus: Maxillary sinus tenderness present. No frontal sinus tenderness.     Left Sinus: Maxillary sinus tenderness and frontal sinus tenderness present.     Mouth/Throat:     Pharynx: Uvula midline. Postnasal drip present. No oropharyngeal exudate or posterior oropharyngeal erythema.  Cardiovascular:     Rate and Rhythm: Normal rate and regular rhythm.     Heart sounds: Normal heart sounds, S1 normal and S2 normal. No murmur heard. Pulmonary:     Effort: Pulmonary effort is normal.     Breath sounds: Normal breath sounds. No wheezing, rhonchi or rales.     Comments: Clear to auscultation bilaterally Psychiatric:        Behavior: Behavior is cooperative.      UC Treatments / Results  Labs (all labs ordered are listed, but only abnormal results are displayed) Labs Reviewed - No data to  display  EKG   Radiology No results found.  Procedures Procedures (including critical care time)  Medications Ordered in UC Medications - No data to display  Initial Impression / Assessment and Plan / UC Course  I have reviewed the triage vital signs and the nursing notes.  Pertinent labs & imaging results that were available during my care of the patient were reviewed by me and considered in my medical decision making (see chart for details).     Patient is well-appearing, afebrile, nontoxic, nontachycardic.  No indication for viral testing as she has been symptomatic for more than a week and this would not change management.  Chest x-ray was deferred as she had no adventitious lung sounds on exam and her oxygen saturation was 97%.  Given her prolonged and recent worsening of symptoms concern for secondary bacterial infection so we will start Augmentin  twice daily for 7 days this will cover for sinus infection as well as otitis media since we are unable to visualize right TM (see below).  No indication for dose adjustment of medication based on her metabolic panel from 12/25/2023 with creatinine of 0.83 and calculated creatinine clearance of 131 mL/min.  Recommended over-the-counter medication such as Mucinex, fluticasone  nasal spray, nasal sinus or sinus rinses for additional symptom relief.  If she is not feeling better within 5 days she is to return for reevaluation if anything worsens she should be seen immediately.  She was noted to have a cerumen impaction on exam.  We attempted irrigation but patient had difficulty tolerating this.  Will  have her start Debrox and she can either return here again to attempt irrigation or follow-up with ENT; she was given the contact information for local provider and can schedule an appointment if she prefers.  We we will cover with Augmentin  for both sinusitis as well as otitis media since we are unable to visualize the TM.  We discussed that if  anything worsens or changes she should return for reevaluation.  Final Clinical Impressions(s) / UC Diagnoses   Final diagnoses:  Acute non-recurrent pansinusitis  Impacted cerumen of right ear     Discharge Instructions      We are treating you for sinus infection.  Start Augmentin  twice daily for 7 days.  I also recommend over-the-counter medication including nasal saline/sinus rinses, fluticasone  nasal spray, Mucinex.  Use humidifier in your room.  If you are not feeling better within 5 days please return for reevaluation.  If anything worsens and you have increasing cough, fever, shortness of breath, chest pain, nausea/vomiting you need to be seen immediately.  You have wax in your right ear.  We were not able to remove all of this so I would like you to use Debrox drops twice daily to help soften the wax.  You can return if you are interested in just attempting to remove the wax again otherwise I would recommend following up with ENT; call to schedule appointment.     ED Prescriptions     Medication Sig Dispense Auth. Provider   amoxicillin -clavulanate (AUGMENTIN ) 875-125 MG tablet Take 1 tablet by mouth every 12 (twelve) hours. 14 tablet Arin Peral K, PA-C   carbamide peroxide (DEBROX) 6.5 % OTIC solution Place 5 drops into the right ear 2 (two) times daily. 15 mL Maritza Goldsborough K, PA-C      PDMP not reviewed this encounter.   Sherrell Rocky POUR, PA-C 04/21/24 1431

## 2024-04-21 NOTE — ED Triage Notes (Signed)
 PT reports for 8 days she has had a cough ,sinus pain ,extreme fatigue, HA .  Pt has tried OTC with out relief.

## 2024-04-21 NOTE — Discharge Instructions (Signed)
 We are treating you for sinus infection.  Start Augmentin  twice daily for 7 days.  I also recommend over-the-counter medication including nasal saline/sinus rinses, fluticasone  nasal spray, Mucinex.  Use humidifier in your room.  If you are not feeling better within 5 days please return for reevaluation.  If anything worsens and you have increasing cough, fever, shortness of breath, chest pain, nausea/vomiting you need to be seen immediately.  You have wax in your right ear.  We were not able to remove all of this so I would like you to use Debrox drops twice daily to help soften the wax.  You can return if you are interested in just attempting to remove the wax again otherwise I would recommend following up with ENT; call to schedule appointment.

## 2024-05-27 ENCOUNTER — Other Ambulatory Visit: Payer: Self-pay | Admitting: Obstetrics and Gynecology

## 2024-05-27 DIAGNOSIS — Z1231 Encounter for screening mammogram for malignant neoplasm of breast: Secondary | ICD-10-CM

## 2024-07-30 ENCOUNTER — Ambulatory Visit
